# Patient Record
Sex: Female | Born: 1975 | Race: White | Hispanic: No | Smoking: Current every day smoker
Health system: Southern US, Community
[De-identification: ages and names within clinical notes are randomized; demographics above are authoritative.]

## PROBLEM LIST (undated history)

## (undated) DIAGNOSIS — E079 Disorder of thyroid, unspecified: Secondary | ICD-10-CM

## (undated) DIAGNOSIS — D649 Anemia, unspecified: Secondary | ICD-10-CM

## (undated) DIAGNOSIS — F329 Major depressive disorder, single episode, unspecified: Secondary | ICD-10-CM

## (undated) DIAGNOSIS — F32A Depression, unspecified: Secondary | ICD-10-CM

## (undated) HISTORY — PX: TONSILLECTOMY: SUR1361

## (undated) HISTORY — PX: THYROIDECTOMY: SHX17

## (undated) HISTORY — PX: CHOLECYSTECTOMY: SHX55

---
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
## 2000-10-23 ENCOUNTER — Emergency Department (HOSPITAL_COMMUNITY): Admission: EM | Admit: 2000-10-23 | Discharge: 2000-10-23 | Payer: Self-pay | Admitting: *Deleted

## 2000-10-26 ENCOUNTER — Emergency Department (HOSPITAL_COMMUNITY): Admission: EM | Admit: 2000-10-26 | Discharge: 2000-10-26 | Payer: Self-pay | Admitting: *Deleted

## 2001-02-26 ENCOUNTER — Emergency Department (HOSPITAL_COMMUNITY): Admission: EM | Admit: 2001-02-26 | Discharge: 2001-02-26 | Payer: Self-pay | Admitting: Emergency Medicine

## 2001-10-08 ENCOUNTER — Emergency Department (HOSPITAL_COMMUNITY): Admission: EM | Admit: 2001-10-08 | Discharge: 2001-10-09 | Payer: Self-pay | Admitting: Emergency Medicine

## 2001-10-10 ENCOUNTER — Emergency Department (HOSPITAL_COMMUNITY): Admission: EM | Admit: 2001-10-10 | Discharge: 2001-10-10 | Payer: Self-pay | Admitting: *Deleted

## 2002-02-15 ENCOUNTER — Emergency Department (HOSPITAL_COMMUNITY): Admission: EM | Admit: 2002-02-15 | Discharge: 2002-02-15 | Payer: Self-pay | Admitting: Emergency Medicine

## 2003-03-06 ENCOUNTER — Emergency Department (HOSPITAL_COMMUNITY): Admission: EM | Admit: 2003-03-06 | Discharge: 2003-03-06 | Payer: Self-pay | Admitting: Emergency Medicine

## 2003-03-24 ENCOUNTER — Emergency Department (HOSPITAL_COMMUNITY): Admission: EM | Admit: 2003-03-24 | Discharge: 2003-03-24 | Payer: Self-pay | Admitting: Emergency Medicine

## 2003-03-30 ENCOUNTER — Emergency Department (HOSPITAL_COMMUNITY): Admission: EM | Admit: 2003-03-30 | Discharge: 2003-03-30 | Payer: Self-pay | Admitting: Internal Medicine

## 2003-04-10 ENCOUNTER — Emergency Department (HOSPITAL_COMMUNITY): Admission: EM | Admit: 2003-04-10 | Discharge: 2003-04-10 | Payer: Self-pay | Admitting: Internal Medicine

## 2003-04-26 ENCOUNTER — Emergency Department (HOSPITAL_COMMUNITY): Admission: EM | Admit: 2003-04-26 | Discharge: 2003-04-26 | Payer: Self-pay | Admitting: *Deleted

## 2003-07-08 ENCOUNTER — Emergency Department (HOSPITAL_COMMUNITY): Admission: EM | Admit: 2003-07-08 | Discharge: 2003-07-08 | Payer: Self-pay | Admitting: Internal Medicine

## 2015-09-30 ENCOUNTER — Emergency Department (HOSPITAL_COMMUNITY)
Admission: EM | Admit: 2015-09-30 | Discharge: 2015-09-30 | Disposition: A | Discharge status: Home / Self Care | Payer: Medicaid Other | Attending: Emergency Medicine | Admitting: Emergency Medicine

## 2015-09-30 ENCOUNTER — Emergency Department (HOSPITAL_COMMUNITY): Payer: Medicaid Other

## 2015-09-30 ENCOUNTER — Encounter (HOSPITAL_COMMUNITY): Payer: Self-pay | Admitting: *Deleted

## 2015-09-30 DIAGNOSIS — F17210 Nicotine dependence, cigarettes, uncomplicated: Secondary | ICD-10-CM | POA: Insufficient documentation

## 2015-09-30 DIAGNOSIS — R1032 Left lower quadrant pain: Secondary | ICD-10-CM | POA: Insufficient documentation

## 2015-09-30 DIAGNOSIS — Z9049 Acquired absence of other specified parts of digestive tract: Secondary | ICD-10-CM | POA: Diagnosis not present

## 2015-09-30 DIAGNOSIS — E039 Hypothyroidism, unspecified: Secondary | ICD-10-CM | POA: Diagnosis not present

## 2015-09-30 DIAGNOSIS — Z79899 Other long term (current) drug therapy: Secondary | ICD-10-CM | POA: Diagnosis not present

## 2015-09-30 DIAGNOSIS — R109 Unspecified abdominal pain: Secondary | ICD-10-CM

## 2015-09-30 LAB — COMPREHENSIVE METABOLIC PANEL
ALT: 15 U/L (ref 14–54)
AST: 21 U/L (ref 15–41)
Albumin: 4.1 g/dL (ref 3.5–5.0)
Alkaline Phosphatase: 88 U/L (ref 38–126)
Anion gap: 9 (ref 5–15)
BILIRUBIN TOTAL: 0.7 mg/dL (ref 0.3–1.2)
BUN: 11 mg/dL (ref 6–20)
CO2: 21 mmol/L — ABNORMAL LOW (ref 22–32)
Calcium: 8 mg/dL — ABNORMAL LOW (ref 8.9–10.3)
Chloride: 105 mmol/L (ref 101–111)
Creatinine, Ser: 1.06 mg/dL — ABNORMAL HIGH (ref 0.44–1.00)
GFR calc Af Amer: >60 mL/min (ref >60)
GFR calc non Af Amer: >60 mL/min (ref >60)
Glucose, Bld: 108 mg/dL — ABNORMAL HIGH (ref 65–99)
POTASSIUM: 3.7 mmol/L (ref 3.5–5.1)
Sodium: 135 mmol/L (ref 135–145)
Total Protein: 7.5 g/dL (ref 6.5–8.1)

## 2015-09-30 LAB — URINALYSIS, ROUTINE W REFLEX MICROSCOPIC
Bilirubin Urine: NEGATIVE
Glucose, UA: NEGATIVE mg/dL
Ketones, ur: NEGATIVE mg/dL
Leukocytes, UA: NEGATIVE
Nitrite: NEGATIVE
Protein, ur: NEGATIVE mg/dL
Specific Gravity, Urine: <1.005 — ABNORMAL LOW (ref 1.005–1.030)
pH: 6 (ref 5.0–8.0)

## 2015-09-30 LAB — CBC
HCT: 37.3 % (ref 36.0–46.0)
HEMOGLOBIN: 11.8 g/dL — AB (ref 12.0–15.0)
MCH: 24.4 pg — ABNORMAL LOW (ref 26.0–34.0)
MCHC: 31.6 g/dL (ref 30.0–36.0)
MCV: 77.1 fL — ABNORMAL LOW (ref 78.0–100.0)
Platelets: 359 10*3/uL (ref 150–400)
RBC: 4.84 MIL/uL (ref 3.87–5.11)
RDW: 16.4 % — ABNORMAL HIGH (ref 11.5–15.5)
WBC: 8.7 10*3/uL (ref 4.0–10.5)

## 2015-09-30 LAB — TSH: TSH: >90 u[IU]/mL — ABNORMAL HIGH (ref 0.350–4.500)

## 2015-09-30 LAB — URINE MICROSCOPIC-ADD ON

## 2015-09-30 LAB — LIPASE, BLOOD: Lipase: 33 U/L (ref 11–51)

## 2015-09-30 LAB — PREGNANCY, URINE: Preg Test, Ur: NEGATIVE

## 2015-09-30 MED ORDER — ONDANSETRON HCL 4 MG/2ML IJ SOLN
4.0000 mg | Freq: Once | INTRAMUSCULAR | Status: AC
  Administered 2015-09-30: 4 mg via INTRAVENOUS
  Filled 2015-09-30: qty 2

## 2015-09-30 MED ORDER — IOPAMIDOL (ISOVUE-300) INJECTION 61%
100.0000 mL | Freq: Once | INTRAVENOUS | Status: AC | PRN
  Administered 2015-09-30: 100 mL via INTRAVENOUS

## 2015-09-30 MED ORDER — SODIUM CHLORIDE 0.9 % IV BOLUS (SEPSIS)
1000.0000 mL | Freq: Once | INTRAVENOUS | Status: AC
  Administered 2015-09-30: 1000 mL via INTRAVENOUS

## 2015-09-30 MED ORDER — SERTRALINE HCL 25 MG PO TABS: 25.0000 mg | ORAL_TABLET | Freq: Every day | ORAL | Status: DC

## 2015-09-30 MED ORDER — MORPHINE SULFATE (PF) 4 MG/ML IV SOLN
4.0000 mg | Freq: Once | INTRAVENOUS | Status: AC
  Administered 2015-09-30: 4 mg via INTRAVENOUS
  Filled 2015-09-30: qty 1

## 2015-09-30 NOTE — Discharge Instructions (Signed)
Prescription for one month for Zoloft 25 mg.  Follow-up your primary care doctor next week. You must take your Synthroid medication. CT scan showed no life-threatening condition.

## 2015-09-30 NOTE — ED Notes (Signed)
Pt comes in left side lower abdominal pain. Pt started her period yesterday. Pt has had symptoms like this before and was told she had hemorrhagic ovarian cysts. Pt has n/v/d.

## 2015-09-30 NOTE — ED Provider Notes (Signed)
CSN: [id number]     Arrival date & time 09/30/15  1542 History   First MD Initiated Contact with Patient 09/30/15 1604     Chief Complaint  Patient presents with  . Abdominal Pain     (Consider location/radiation/quality/duration/timing/severity/associated sxs/prior Treatment) HPI.... Lower abdominal pain for several months. Patient has been evaluated in multiple locations including 3 ultrasounds which have shown a "hemorrhagic cyst and ruptured cyst". She is in transition from [place] to [place] at this time and does not have a primary care doctor. The lower abdominal pain seems to start approximately one week prior to her menstrual period. She started her period yesterday. No fever, sweats, chills, dysuria, chest pain, dyspnea. Patient is hypothyroid and takes her Synthroid erratically. In the past she has been usually benefited by Zoloft  History reviewed. No pertinent past medical history. Past Surgical History  Procedure Laterality Date  . Thyroidectomy    . Cholecystectomy    . Tonsillectomy     No family history on file. Social History  Substance Use Topics  . Smoking status: Current Every Day Smoker -- 0.50 packs/day    Types: Cigarettes  . Smokeless tobacco: None  . Alcohol Use: No   OB History    No data available     Review of Systems  All other systems reviewed and are negative.     Allergies  Review of patient's allergies indicates no known allergies.  Home Medications   Prior to Admission medications   Medication Sig Start Date End Date Taking? Authorizing Provider  levothyroxine (SYNTHROID, LEVOTHROID) 200 MCG tablet Take 200 mcg by mouth daily before breakfast.   Yes Historical Provider, MD  sertraline (ZOLOFT) 25 MG tablet Take 1 tablet (25 mg total) by mouth daily. 09/30/15   Donnetta Hutching, MD   BP 123/78 mmHg  Pulse 65  Temp(Src) 98.2 F (36.8 C) (Oral)  Resp 14  Ht  (1.651 m)  Wt 216 lb (97.977 kg)  BMI 35.94 kg/m2  SpO2 100%  LMP  09/29/2015 Physical Exam  Constitutional: She is oriented to person, place, and time.  Obese.  HENT:  Head: Normocephalic and atraumatic.  Eyes: Conjunctivae and EOM are normal. Pupils are equal, round, and reactive to light.  Neck: Normal range of motion. Neck supple.  Cardiovascular: Normal rate and regular rhythm.   Pulmonary/Chest: Effort normal and breath sounds normal.  Abdominal: Soft. Bowel sounds are normal.  Minimal bilateral lower abdominal tenderness.  Musculoskeletal: Normal range of motion.  Neurological: She is alert and oriented to person, place, and time.  Skin: Skin is warm and dry.  Psychiatric: She has a normal mood and affect. Her behavior is normal.  Nursing note and vitals reviewed.   ED Course  Procedures (including critical care time) Labs Review Labs Reviewed  COMPREHENSIVE METABOLIC PANEL - Abnormal; Notable for the following:    CO2 21 (*)    Glucose, Bld 108 (*)    Creatinine, Ser 1.06 (*)    Calcium 8.0 (*)    All other components within normal limits  CBC - Abnormal; Notable for the following:    Hemoglobin 11.8 (*)    MCV 77.1 (*)    MCH 24.4 (*)    RDW 16.4 (*)    All other components within normal limits  URINALYSIS, ROUTINE W REFLEX MICROSCOPIC (NOT AT [facility]) - Abnormal; Notable for the following:    Specific Gravity, Urine <1.005 (*)    Hgb urine dipstick LARGE (*)    All  other components within normal limits  TSH - Abnormal; Notable for the following:    TSH >90.000 (*)    All other components within normal limits  URINE MICROSCOPIC-ADD ON - Abnormal; Notable for the following:    Squamous Epithelial / LPF 0-5 (*)    Bacteria, UA RARE (*)    All other components within normal limits  LIPASE, BLOOD  PREGNANCY, URINE    Imaging Review Ct Abdomen Pelvis W Contrast  09/30/2015  CLINICAL DATA:  Left lower quadrant abdominal pain since yesterday. Nausea. EXAM: CT ABDOMEN AND PELVIS WITH CONTRAST TECHNIQUE: Multidetector CT imaging of  the abdomen and pelvis was performed using the standard protocol following bolus administration of intravenous contrast. CONTRAST:  100mL ISOVUE-300 IOPAMIDOL (ISOVUE-300) INJECTION 61% COMPARISON:  None. FINDINGS: Lower chest: The lung bases are clear of acute process. No pleural effusion or pulmonary lesions. The heart is normal in size. No pericardial effusion. The distal esophagus and aorta are unremarkable. Hepatobiliary: No focal hepatic lesions or intrahepatic biliary dilatation. The gallbladder is surgically absent. No common bile duct dilatation. Pancreas: No mass, inflammation or ductal dilatation. Spleen: Normal size.  No focal lesions. Adrenals/Urinary Tract: The adrenal glands and kidneys are normal. Stomach/Bowel: The stomach, duodenum, small bowel and colon are unremarkable. No inflammatory changes, mass lesions or obstructive findings. The terminal ileum is normal. The appendix is normal. Vascular/Lymphatic: No mesenteric or retroperitoneal mass or adenopathy. Small scattered lymph nodes are noted. The aorta and branch vessels are patent. The major venous structures are patent. Reproductive: The uterus is retroverted. Suspect small fibroids. Nabothian cysts are noted near the cervix. Both ovaries are normal. Other: No pelvic mass or adenopathy. No free pelvic fluid collections. No inguinal mass or adenopathy. The bladder wall demonstrates mild symmetric wall thickening which may be due to lack of distension. Musculoskeletal: No significant bony findings. 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 IMPRESSION: 1. No acute abdominal/pelvic findings, mass lesions or lymphadenopathy. 2. Status post cholecystectomy without biliary dilatation. 3. Slightly enlarged retroverted uterus with probable fibroids and nabothian cysts. Electronically Signed   By: Rudie MeyerP.  Gallerani M.D.   On: 09/30/2015 18:53   I have personally reviewed  and evaluated these images and lab results as part of my medical decision-making.   EKG Interpretation None      MDM   Final diagnoses:  Abdominal pain, unspecified abdominal location  Hypothyroidism, unspecified hypothyroidism type   Patient is in no acute distress. White count normal. CT scan of abdomen/pelvis shows no acute findings. TSH greater than 90. Will Rx Zoloft 25 mg daily. Patient encouraged to take her Synthroid daily. She will get primary care follow-up within the week    Donnetta HutchingBrian Camari Quintanilla, MD 09/30/15 [phone]

## 2017-03-11 IMAGING — CT CT ABD-PELV W/ CM
2 of 4 series · 16 of 46 positions shown, 18 images · IV contrast (Omnipaque 300)
Comparison: None.

CLINICAL DATA: Left lower quadrant abdominal pain since yesterday.
Nausea.

EXAM:
CT ABDOMEN AND PELVIS WITH CONTRAST
TECHNIQUE: Multidetector CT imaging of the abdomen and pelvis was performed
using the standard protocol following bolus administration of
intravenous contrast.
CONTRAST:  100mL Q326PI-1PP IOPAMIDOL (Q326PI-1PP) INJECTION 61%

[Series 2: abd_pel_with 5.0 b40s · axial · 0.85mm/px · z∈[-468,-28]mm · 13 of 97 slices shown, 15 images]
[im 5/97  soft-tissue]
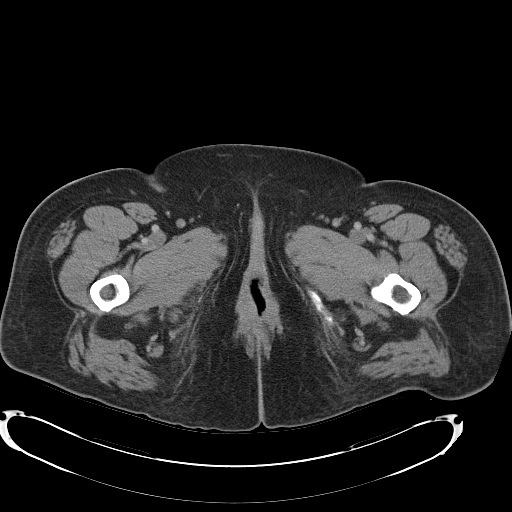
[im 5/97  bone]
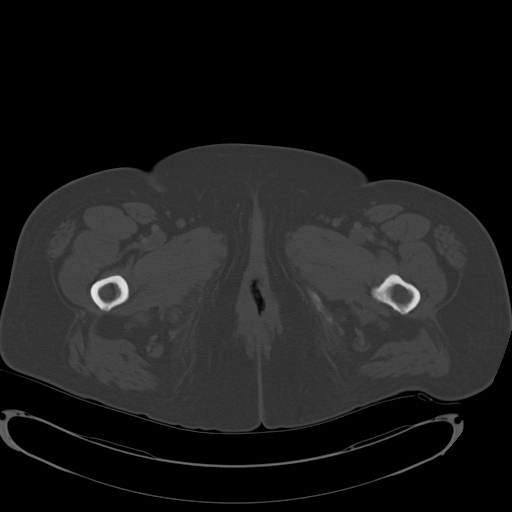
[im 13/97  soft-tissue]
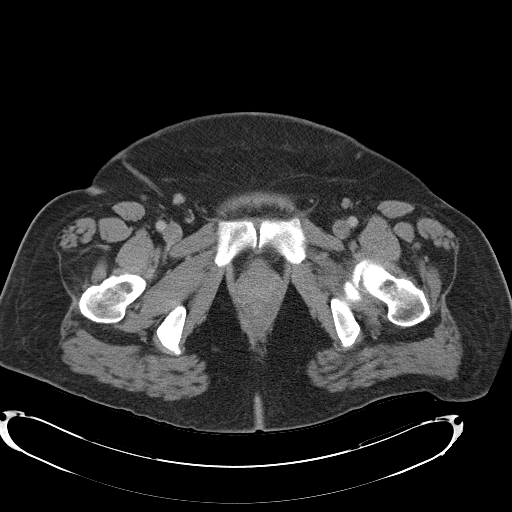
[im 21/97  soft-tissue]
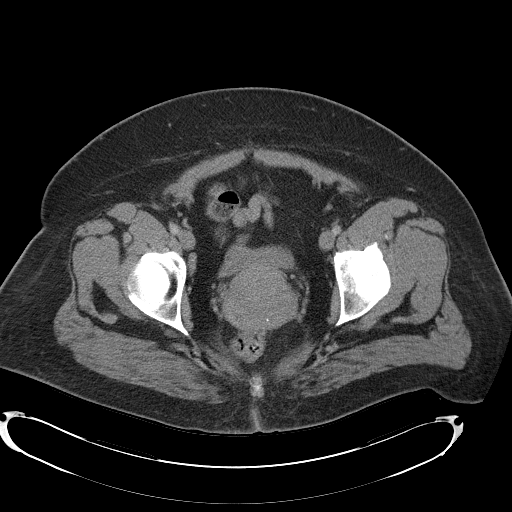
[im 29/97  soft-tissue]
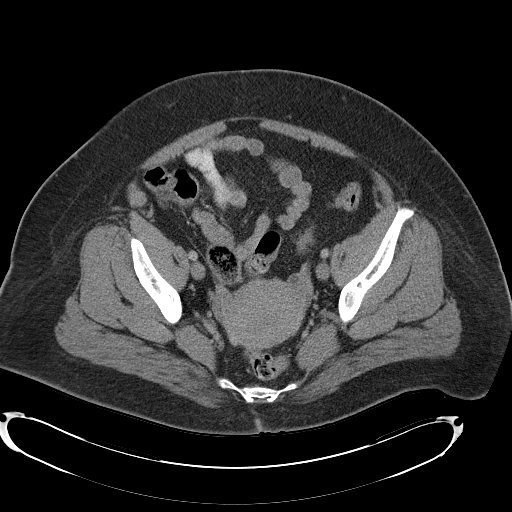
[im 33/97  soft-tissue]
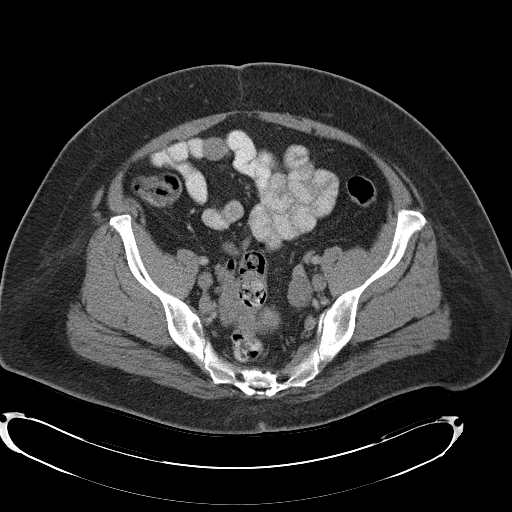
[im 41/97  soft-tissue]
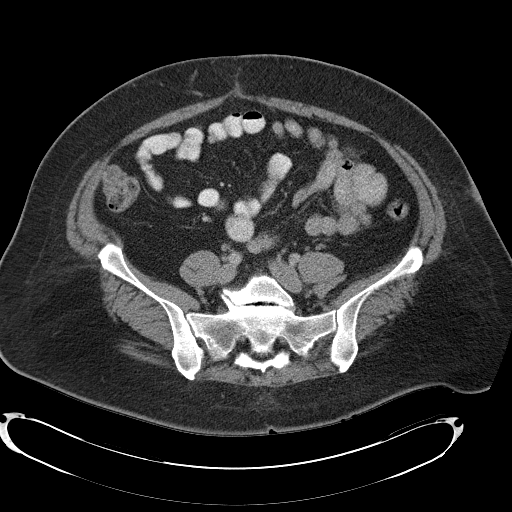
[im 49/97  soft-tissue]
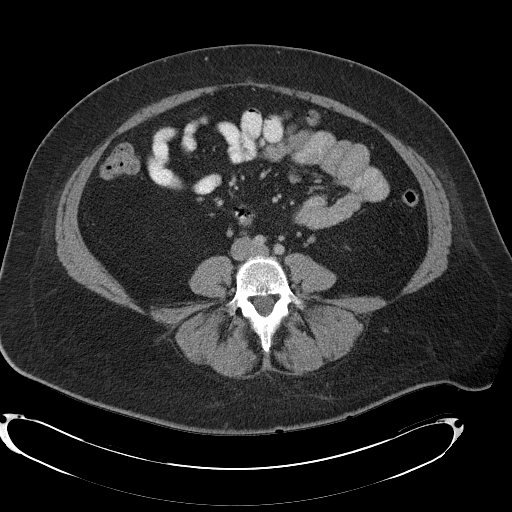
[im 57/97  soft-tissue]
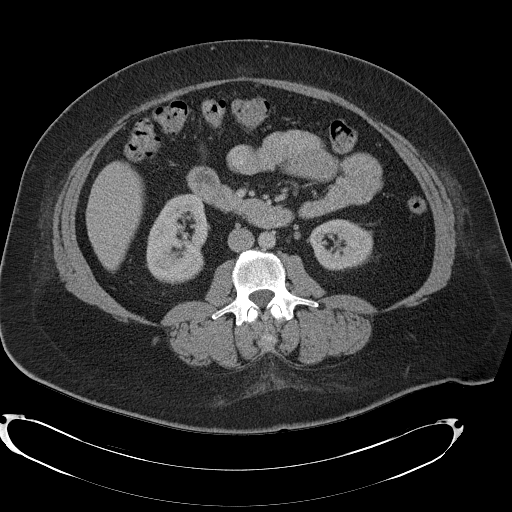
[im 65/97  soft-tissue]
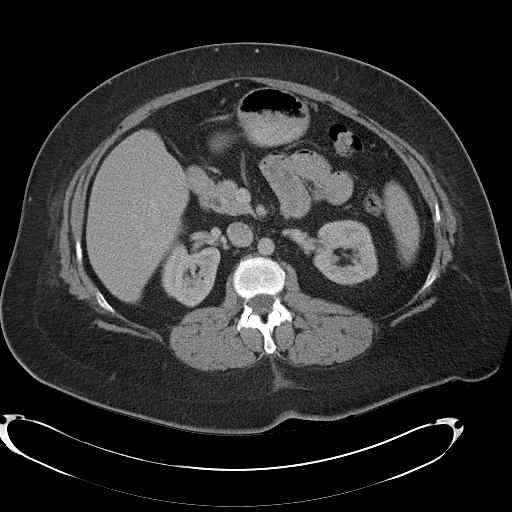
[im 65/97  bone]
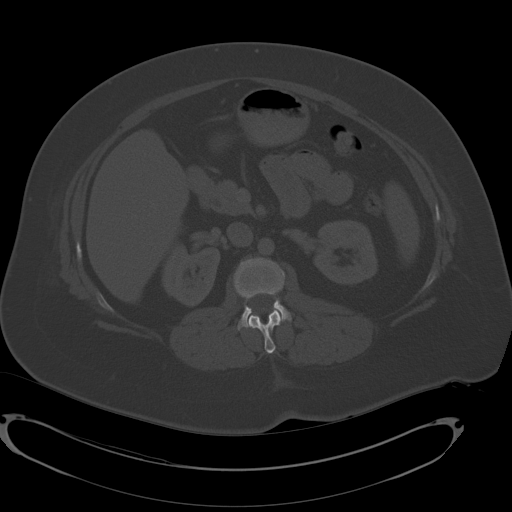
[im 69/97  soft-tissue]
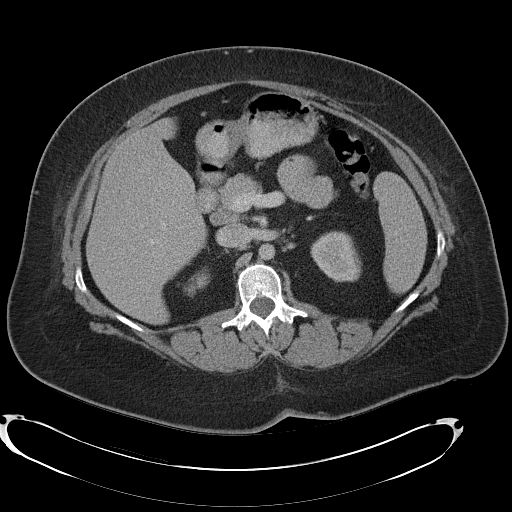
[im 77/97  soft-tissue]
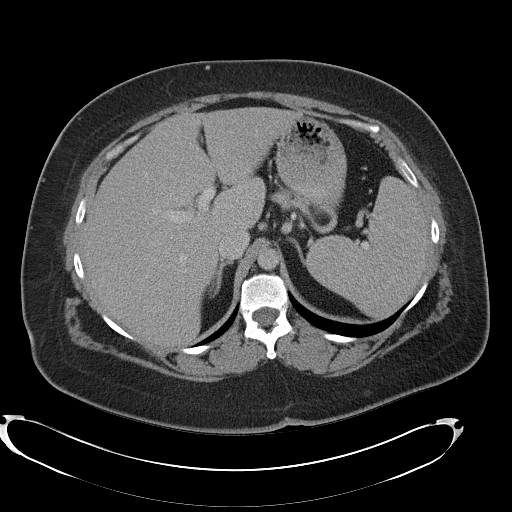
[im 85/97  soft-tissue]
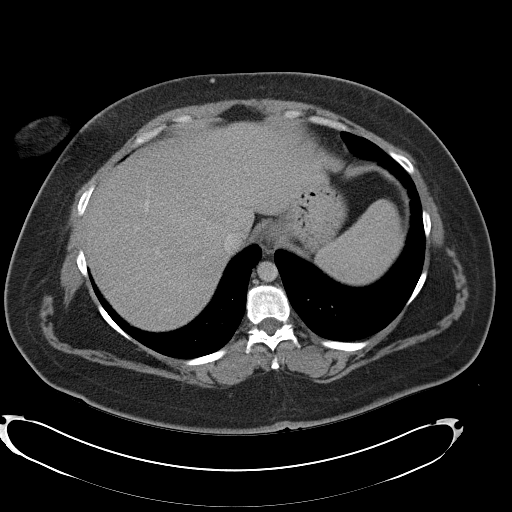
[im 93/97  soft-tissue]
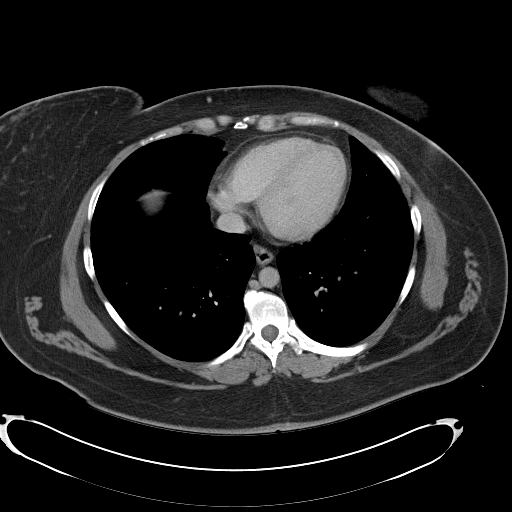

[Series 4: mpr cor post contrast (id) · coronal · 0.86mm/px · 3 of 110 slices shown]
[im 37/110  soft-tissue]
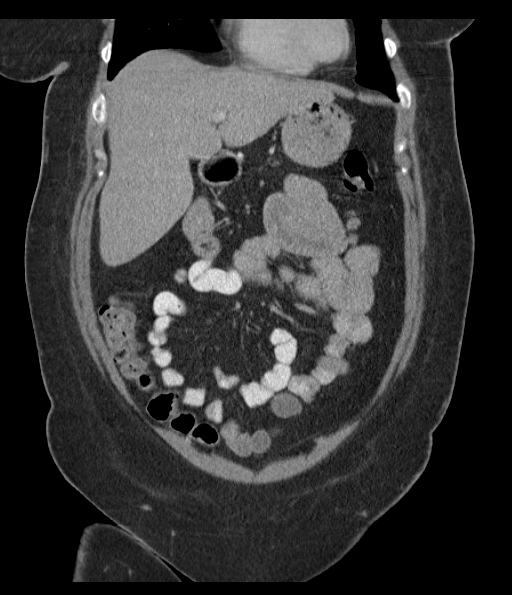
[im 49/110  soft-tissue]
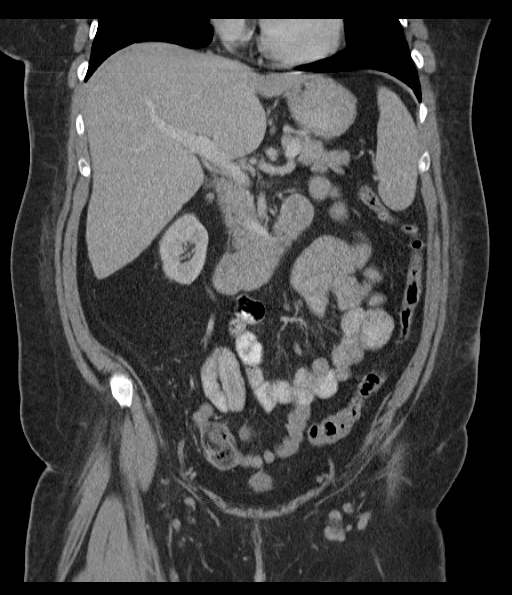
[im 61/110  soft-tissue]
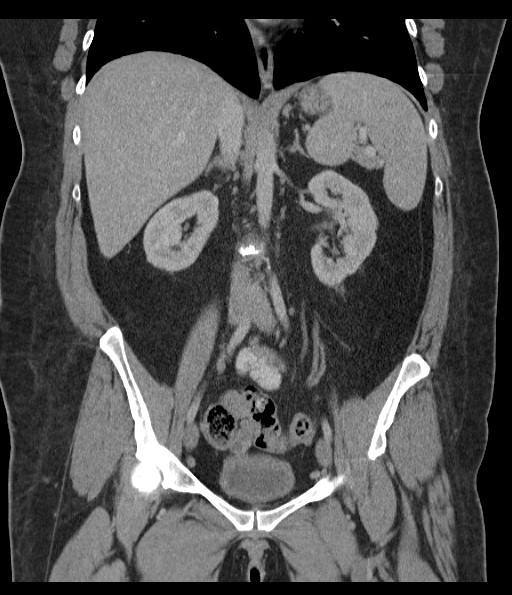

[16 of 46 positions shown; findings below may reference images not displayed]

FINDINGS: Lower chest: The lung bases are clear of acute process. No pleural
effusion or pulmonary lesions. The heart is normal in size. No
pericardial effusion. The distal esophagus and aorta are
unremarkable.

Hepatobiliary: No focal hepatic lesions or intrahepatic biliary
dilatation. The gallbladder is surgically absent. No common bile
duct dilatation.

Pancreas: No mass, inflammation or ductal dilatation.

Spleen: Normal size.  No focal lesions.

Adrenals/Urinary Tract: The adrenal glands and kidneys are normal.

Stomach/Bowel: The stomach, duodenum, small bowel and colon are
unremarkable. No inflammatory changes, mass lesions or obstructive
findings. The terminal ileum is normal. The appendix is normal.

Vascular/Lymphatic: No mesenteric or retroperitoneal mass or
adenopathy. Small scattered lymph nodes are noted. The aorta and
branch vessels are patent. The major venous structures are patent.

Reproductive: The uterus is retroverted. Suspect small fibroids.
Nabothian cysts are noted near the cervix. Both ovaries are normal.

Other: No pelvic mass or adenopathy. No free pelvic fluid
collections. No inguinal mass or adenopathy. The bladder wall
demonstrates mild symmetric wall thickening which may be due to lack
of distension.

Musculoskeletal: No significant bony findings. There are bilateral
pars defects at L5 with a grade 2 spondylolisthesis and advanced
disc disease and facet disease at L5-S1.
IMPRESSION: 1. No acute abdominal/pelvic findings, mass lesions or
lymphadenopathy.
2. Status post cholecystectomy without biliary dilatation.
3. Slightly enlarged retroverted uterus with probable fibroids and
nabothian cysts.

## 2019-06-30 ENCOUNTER — Other Ambulatory Visit: Payer: Self-pay

## 2019-06-30 ENCOUNTER — Encounter (HOSPITAL_BASED_OUTPATIENT_CLINIC_OR_DEPARTMENT_OTHER): Payer: Self-pay | Admitting: Emergency Medicine

## 2019-06-30 ENCOUNTER — Inpatient Hospital Stay (HOSPITAL_BASED_OUTPATIENT_CLINIC_OR_DEPARTMENT_OTHER)
Admission: EM | Admit: 2019-06-30 | Discharge: 2019-07-04 | DRG: 372 | Disposition: A | Discharge status: Home / Self Care | Payer: Medicaid Other | Attending: General Surgery | Admitting: General Surgery

## 2019-06-30 ENCOUNTER — Emergency Department (HOSPITAL_BASED_OUTPATIENT_CLINIC_OR_DEPARTMENT_OTHER): Payer: Medicaid Other

## 2019-06-30 DIAGNOSIS — E039 Hypothyroidism, unspecified: Secondary | ICD-10-CM | POA: Diagnosis present

## 2019-06-30 DIAGNOSIS — R1030 Lower abdominal pain, unspecified: Secondary | ICD-10-CM | POA: Diagnosis present

## 2019-06-30 DIAGNOSIS — F17210 Nicotine dependence, cigarettes, uncomplicated: Secondary | ICD-10-CM | POA: Diagnosis present

## 2019-06-30 DIAGNOSIS — Z20822 Contact with and (suspected) exposure to covid-19: Secondary | ICD-10-CM | POA: Diagnosis present

## 2019-06-30 DIAGNOSIS — K3532 Acute appendicitis with perforation and localized peritonitis, without abscess: Secondary | ICD-10-CM | POA: Diagnosis present

## 2019-06-30 DIAGNOSIS — D509 Iron deficiency anemia, unspecified: Secondary | ICD-10-CM | POA: Diagnosis present

## 2019-06-30 DIAGNOSIS — R17 Unspecified jaundice: Secondary | ICD-10-CM | POA: Diagnosis not present

## 2019-06-30 DIAGNOSIS — F329 Major depressive disorder, single episode, unspecified: Secondary | ICD-10-CM | POA: Diagnosis not present

## 2019-06-30 DIAGNOSIS — K352 Acute appendicitis with generalized peritonitis, without abscess: Secondary | ICD-10-CM

## 2019-06-30 HISTORY — DX: Depression, unspecified: F32.A

## 2019-06-30 HISTORY — DX: Disorder of thyroid, unspecified: E07.9

## 2019-06-30 HISTORY — DX: Anemia, unspecified: D64.9

## 2019-06-30 LAB — CBC WITH DIFFERENTIAL/PLATELET
Abs Immature Granulocytes: 0.11 10*3/uL — ABNORMAL HIGH (ref 0.00–0.07)
Basophils Absolute: 0 10*3/uL (ref 0.0–0.1)
Basophils Relative: 0 %
Eosinophils Absolute: 0 10*3/uL (ref 0.0–0.5)
Eosinophils Relative: 0 %
HCT: 31.8 % — ABNORMAL LOW (ref 36.0–46.0)
Hemoglobin: 9.1 g/dL — ABNORMAL LOW (ref 12.0–15.0)
Immature Granulocytes: 1 %
Lymphocytes Relative: 8 %
Lymphs Abs: 1.1 10*3/uL (ref 0.7–4.0)
MCH: 20.4 pg — ABNORMAL LOW (ref 26.0–34.0)
MCHC: 28.6 g/dL — ABNORMAL LOW (ref 30.0–36.0)
MCV: 71.3 fL — ABNORMAL LOW (ref 80.0–100.0)
Monocytes Absolute: 1 10*3/uL (ref 0.1–1.0)
Monocytes Relative: 7 %
Neutro Abs: 11.6 10*3/uL — ABNORMAL HIGH (ref 1.7–7.7)
Neutrophils Relative %: 84 %
Platelets: 388 10*3/uL (ref 150–400)
RBC: 4.46 MIL/uL (ref 3.87–5.11)
RDW: 17.2 % — ABNORMAL HIGH (ref 11.5–15.5)
WBC: 13.9 10*3/uL — ABNORMAL HIGH (ref 4.0–10.5)
nRBC: 0 % (ref 0.0–0.2)

## 2019-06-30 LAB — COMPREHENSIVE METABOLIC PANEL
ALT: 23 U/L (ref 0–44)
AST: 26 U/L (ref 15–41)
Albumin: 3.9 g/dL (ref 3.5–5.0)
Alkaline Phosphatase: 83 U/L (ref 38–126)
Anion gap: 11 (ref 5–15)
BUN: 10 mg/dL (ref 6–20)
CO2: 23 mmol/L (ref 22–32)
Calcium: 8.3 mg/dL — ABNORMAL LOW (ref 8.9–10.3)
Chloride: 101 mmol/L (ref 98–111)
Creatinine, Ser: 0.74 mg/dL (ref 0.44–1.00)
GFR calc Af Amer: >60 mL/min (ref >60)
GFR calc non Af Amer: >60 mL/min (ref >60)
Glucose, Bld: 131 mg/dL — ABNORMAL HIGH (ref 70–99)
Potassium: 3.7 mmol/L (ref 3.5–5.1)
Sodium: 135 mmol/L (ref 135–145)
Total Bilirubin: 2.4 mg/dL — ABNORMAL HIGH (ref 0.3–1.2)
Total Protein: 7.4 g/dL (ref 6.5–8.1)

## 2019-06-30 LAB — HCG, QUANTITATIVE, PREGNANCY: hCG, Beta Chain, Quant, S: <1 m[IU]/mL (ref <5)

## 2019-06-30 LAB — LIPASE, BLOOD: Lipase: 22 U/L (ref 11–51)

## 2019-06-30 LAB — URINALYSIS, ROUTINE W REFLEX MICROSCOPIC
Bilirubin Urine: NEGATIVE
Glucose, UA: NEGATIVE mg/dL
Ketones, ur: 20 mg/dL — AB
Leukocytes,Ua: NEGATIVE
Nitrite: NEGATIVE
Protein, ur: 30 mg/dL — AB
Specific Gravity, Urine: >1.046 — ABNORMAL HIGH (ref 1.005–1.030)
pH: 6 (ref 5.0–8.0)

## 2019-06-30 LAB — SURGICAL PCR SCREEN
MRSA, PCR: NEGATIVE
Staphylococcus aureus: POSITIVE — AB

## 2019-06-30 LAB — SARS CORONAVIRUS 2 BY RT PCR (HOSPITAL ORDER, PERFORMED IN ~~LOC~~ HOSPITAL LAB): SARS Coronavirus 2: NEGATIVE

## 2019-06-30 LAB — HIV ANTIBODY (ROUTINE TESTING W REFLEX): HIV Screen 4th Generation wRfx: NONREACTIVE

## 2019-06-30 MED ORDER — PIPERACILLIN-TAZOBACTAM 3.375 G IVPB
3.3750 g | Freq: Three times a day (TID) | INTRAVENOUS | Status: DC
  Administered 2019-06-30 – 2019-07-04 (×13): 3.375 g via INTRAVENOUS
  Filled 2019-06-30 (×13): qty 50

## 2019-06-30 MED ORDER — AMPHETAMINE-DEXTROAMPHET ER 10 MG PO CP24: 10.0000 mg | ORAL_CAPSULE | Freq: Every day | ORAL | Status: DC

## 2019-06-30 MED ORDER — ENOXAPARIN SODIUM 60 MG/0.6ML ~~LOC~~ SOLN
50.0000 mg | SUBCUTANEOUS | Status: DC
  Administered 2019-06-30 – 2019-07-02 (×2): 50 mg via SUBCUTANEOUS
  Filled 2019-06-30: qty 0.6
  Filled 2019-06-30: qty 0.5

## 2019-06-30 MED ORDER — ACETAMINOPHEN 325 MG PO TABS: 650.0000 mg | ORAL_TABLET | Freq: Four times a day (QID) | ORAL | Status: DC | PRN

## 2019-06-30 MED ORDER — ONDANSETRON 4 MG PO TBDP: 4.0000 mg | ORAL_TABLET | Freq: Four times a day (QID) | ORAL | Status: DC | PRN

## 2019-06-30 MED ORDER — KETOROLAC TROMETHAMINE 30 MG/ML IJ SOLN: 30.0000 mg | Freq: Four times a day (QID) | INTRAMUSCULAR | Status: DC | PRN

## 2019-06-30 MED ORDER — ENOXAPARIN SODIUM 40 MG/0.4ML ~~LOC~~ SOLN: 40.0000 mg | SUBCUTANEOUS | Status: DC

## 2019-06-30 MED ORDER — LEVOTHYROXINE SODIUM 100 MCG PO TABS
200.0000 ug | ORAL_TABLET | Freq: Every day | ORAL | Status: DC
  Administered 2019-06-30 – 2019-07-04 (×5): 200 ug via ORAL
  Filled 2019-06-30: qty 1
  Filled 2019-06-30: qty 2
  Filled 2019-06-30: qty 4
  Filled 2019-06-30 (×3): qty 2

## 2019-06-30 MED ORDER — IOHEXOL 300 MG/ML  SOLN
100.0000 mL | Freq: Once | INTRAMUSCULAR | Status: AC | PRN
  Administered 2019-06-30: 100 mL via INTRAVENOUS

## 2019-06-30 MED ORDER — SODIUM CHLORIDE 0.9 % IV SOLN: Freq: Once | INTRAVENOUS | Status: AC

## 2019-06-30 MED ORDER — OXYCODONE HCL 5 MG PO TABS
5.0000 mg | ORAL_TABLET | ORAL | Status: DC | PRN
  Administered 2019-06-30 – 2019-07-02 (×5): 10 mg via ORAL
  Administered 2019-07-03 (×2): 5 mg via ORAL
  Administered 2019-07-04: 10 mg via ORAL
  Administered 2019-07-04: 5 mg via ORAL
  Filled 2019-06-30 (×2): qty 2
  Filled 2019-06-30: qty 1
  Filled 2019-06-30: qty 2
  Filled 2019-06-30: qty 1
  Filled 2019-06-30 (×4): qty 2

## 2019-06-30 MED ORDER — ACETAMINOPHEN 650 MG RE SUPP: 650.0000 mg | Freq: Four times a day (QID) | RECTAL | Status: DC | PRN

## 2019-06-30 MED ORDER — SERTRALINE HCL 50 MG PO TABS: 25.0000 mg | ORAL_TABLET | Freq: Every day | ORAL | Status: DC

## 2019-06-30 MED ORDER — FENTANYL CITRATE (PF) 100 MCG/2ML IJ SOLN
50.0000 ug | INTRAMUSCULAR | Status: DC | PRN
  Administered 2019-06-30 (×2): 50 ug via INTRAVENOUS
  Filled 2019-06-30 (×2): qty 2

## 2019-06-30 MED ORDER — MORPHINE SULFATE (PF) 2 MG/ML IV SOLN
1.0000 mg | INTRAVENOUS | Status: DC | PRN
  Administered 2019-06-30: 2 mg via INTRAVENOUS
  Filled 2019-06-30: qty 1

## 2019-06-30 MED ORDER — PIPERACILLIN-TAZOBACTAM 3.375 G IVPB 30 MIN
3.3750 g | Freq: Once | INTRAVENOUS | Status: AC
  Administered 2019-06-30: 04:00:00 3.375 g via INTRAVENOUS
  Filled 2019-06-30 (×2): qty 50

## 2019-06-30 MED ORDER — KCL IN DEXTROSE-NACL 20-5-0.9 MEQ/L-%-% IV SOLN
INTRAVENOUS | Status: DC
  Filled 2019-06-30 (×5): qty 1000

## 2019-06-30 MED ORDER — BUSPIRONE HCL 10 MG PO TABS: 15.0000 mg | ORAL_TABLET | Freq: Two times a day (BID) | ORAL | Status: DC

## 2019-06-30 MED ORDER — KETOROLAC TROMETHAMINE 30 MG/ML IJ SOLN
15.0000 mg | Freq: Four times a day (QID) | INTRAMUSCULAR | Status: DC
  Administered 2019-06-30 – 2019-07-03 (×10): 15 mg via INTRAVENOUS
  Filled 2019-06-30 (×10): qty 1

## 2019-06-30 MED ORDER — KETOROLAC TROMETHAMINE 30 MG/ML IJ SOLN: 30.0000 mg | Freq: Four times a day (QID) | INTRAMUSCULAR | Status: DC

## 2019-06-30 MED ORDER — ONDANSETRON HCL 4 MG/2ML IJ SOLN
4.0000 mg | Freq: Once | INTRAMUSCULAR | Status: AC
  Administered 2019-06-30: 4 mg via INTRAVENOUS
  Filled 2019-06-30: qty 2

## 2019-06-30 MED ORDER — FENTANYL CITRATE (PF) 100 MCG/2ML IJ SOLN
100.0000 ug | Freq: Once | INTRAMUSCULAR | Status: AC
  Administered 2019-06-30: 02:00:00 100 ug via INTRAVENOUS
  Filled 2019-06-30: qty 2

## 2019-06-30 MED ORDER — ONDANSETRON HCL 4 MG/2ML IJ SOLN
4.0000 mg | Freq: Four times a day (QID) | INTRAMUSCULAR | Status: DC | PRN
  Administered 2019-06-30 – 2019-07-02 (×2): 4 mg via INTRAVENOUS
  Filled 2019-06-30 (×2): qty 2

## 2019-06-30 NOTE — ED Provider Notes (Signed)
I assumed care of this patient from Dr. Read Drivers.  Please see their note for further details of Hx, PE.  Briefly patient is a 44 y.o. female who presented as a transfer from med [facility] for ruptured appendicitis.  General surgery already aware the patient.  Will call them to inform them of their arrival..    CRITICAL CARE Performed by: Amadeo Garnet Jabriel Vanduyne Total critical care time: 20 minutes Critical care time was exclusive of separately billable procedures and treating other patients. Critical care was necessary to treat or prevent imminent or life-threatening deterioration. Critical care was time spent personally by me on the following activities: development of treatment plan with patient and/or surrogate as well as nursing, discussions with consultants, evaluation of patient's response to treatment, examination of patient, obtaining history from patient or surrogate, ordering and performing treatments and interventions, ordering and review of laboratory studies, ordering and review of radiographic studies, pulse oximetry and re-evaluation of patient's condition.     Nira Conn, MD 06/30/19 [phone]

## 2019-06-30 NOTE — H&P (Signed)
Tanya Robbins is an 44 y.o. female.   Chief Complaint: Abdominal pain HPI: Patient transferred from the [facility] emergency room after being seen by Dr. Florina Ou for a 3-day history of lower abdominal pain.  The pain started 3 days ago and was diffuse in her lower abdomen.  Last night it became worse and more centralized and toward the left lower quadrant.  The pain was made worse by coughing or moving.  It is sharp in nature and severe without radiation.  Pain medication does help relieve it.  CT scan showed a left-sided cecum with a perforated appendix with small micro lobules of free air around the appendix.  There is trace pelvic fluid.  She complains of lower abdominal pain which is severe 8 out of 10 without radiation location left lower quadrant and below her umbilicus.  History reviewed. No pertinent past medical history.  Past Surgical History:  Procedure Laterality Date  . CHOLECYSTECTOMY    . THYROIDECTOMY    . TONSILLECTOMY      No family history on file. Social History:  reports that she has been smoking cigarettes. She has been smoking about 0.50 packs per day. She does not have any smokeless tobacco history on file. She reports that she does not drink alcohol or use drugs.  Allergies: No Known Allergies  (Not in a hospital admission)   Results for orders placed or performed during the hospital encounter of 06/30/19 (from the past 48 hour(s))  CBC with Differential     Status: Abnormal   Collection Time: 06/30/19  2:10 AM  Result Value Ref Range   WBC 13.9 (H) 4.0 - 10.5 K/uL   RBC 4.46 3.87 - 5.11 MIL/uL   Hemoglobin 9.1 (L) 12.0 - 15.0 g/dL   HCT 31.8 (L) 36.0 - 46.0 %   MCV 71.3 (L) 80.0 - 100.0 fL   MCH 20.4 (L) 26.0 - 34.0 pg   MCHC 28.6 (L) 30.0 - 36.0 g/dL   RDW 17.2 (H) 11.5 - 15.5 %   Platelets 388 150 - 400 K/uL   nRBC 0.0 0.0 - 0.2 %   Neutrophils Relative % 84 %   Neutro Abs 11.6 (H) 1.7 - 7.7 K/uL   Lymphocytes Relative 8 %   Lymphs Abs 1.1 0.7 -  4.0 K/uL   Monocytes Relative 7 %   Monocytes Absolute 1.0 0.1 - 1.0 K/uL   Eosinophils Relative 0 %   Eosinophils Absolute 0.0 0.0 - 0.5 K/uL   Basophils Relative 0 %   Basophils Absolute 0.0 0.0 - 0.1 K/uL   Immature Granulocytes 1 %   Abs Immature Granulocytes 0.11 (H) 0.00 - 0.07 K/uL    Comment: Performed at [facility], [address] [place], [place] [address]  Comprehensive metabolic panel     Status: Abnormal   Collection Time: 06/30/19  2:10 AM  Result Value Ref Range   Sodium 135 135 - 145 mmol/L   Potassium 3.7 3.5 - 5.1 mmol/L   Chloride 101 98 - 111 mmol/L   CO2 23 22 - 32 mmol/L   Glucose, Bld 131 (H) 70 - 99 mg/dL   BUN 10 6 - 20 mg/dL   Creatinine, Ser 0.74 0.44 - 1.00 mg/dL   Calcium 8.3 (L) 8.9 - 10.3 mg/dL   Total Protein 7.4 6.5 - 8.1 g/dL   Albumin 3.9 3.5 - 5.0 g/dL   AST 26 15 - 41 U/L   ALT 23 0 - 44 U/L  Alkaline Phosphatase 83 38 - 126 U/L   Total Bilirubin 2.4 (H) 0.3 - 1.2 mg/dL   GFR calc non Af Amer >60 >60 mL/min   GFR calc Af Amer >60 >60 mL/min   Anion gap 11 5 - 15    Comment: Performed at [facility], [address] [place], [place] [address]  Lipase, blood     Status: None   Collection Time: 06/30/19  2:10 AM  Result Value Ref Range   Lipase 22 11 - 51 U/L    Comment: Performed at [facility], [address] [place], [place] [address]  hCG, quantitative, pregnancy     Status: None   Collection Time: 06/30/19  2:10 AM  Result Value Ref Range   hCG, Beta Chain, Quant, S <1 <5 mIU/mL    Comment:          GEST. AGE      CONC.  (mIU/mL)   <=1 WEEK        5 - 50     2 WEEKS       50 - 500     3 WEEKS       100 - 10,000     4 WEEKS     1,000 - 30,000     5 WEEKS     3,500 - 115,000   6-8 WEEKS     12,000 - 270,000    12 WEEKS     15,000 - 220,000        FEMALE AND NON-PREGNANT FEMALE:     LESS THAN 5 mIU/mL Performed at [facility], [address] [place], [place] [address]   SARS  Coronavirus 2 by RT PCR (hospital order, performed in [facility] hospital lab) Nasopharyngeal Nasopharyngeal Swab     Status: None   Collection Time: 06/30/19  3:57 AM   Specimen: Nasopharyngeal Swab  Result Value Ref Range   SARS Coronavirus 2 NEGATIVE NEGATIVE    Comment: Performed at [facility], [address] [place], [place] [address]   CT ABDOMEN PELVIS W CONTRAST  Result Date: 06/30/2019 CLINICAL DATA:  Diffuse abdominal pain for 3 days. EXAM: CT ABDOMEN AND PELVIS WITH CONTRAST TECHNIQUE: Multidetector CT imaging of the abdomen and pelvis was performed using the standard protocol following bolus administration of intravenous contrast. CONTRAST:  OMNIPAQUE IOHEXOL 300 MG/ML  SOLN COMPARISON:  CT abdomen pelvis 09/30/2015 FINDINGS: Lower chest: Lung bases are clear. Normal heart size. No pericardial effusion. Hepatobiliary: No focal liver abnormality is seen. Patient is post cholecystectomy. Slight prominence of the biliary tree likely related to reservoir effect. No calcified intraductal gallstones. Pancreas: Unremarkable. No pancreatic ductal dilatation or surrounding inflammatory changes. Spleen: Normal in size without focal abnormality. Adrenals/Urinary Tract: Normal adrenal glands. Mild bilateral symmetric perinephric stranding, a nonspecific finding though may correlate with either age or decreased renal function. No worrisome renal lesions. No urolithiasis or hydronephrosis. Mild bladder wall thickening may be partially attributable to decompression of the time of exam. Stomach/Bowel: Distal esophagus, stomach and duodenal sweep are unremarkable. There are a few focally dilated loops of small bowel in the left upper quadrant in a region of extensive stranding and phlegmonous change which may reflect a focal reactive ileus. The cecum is displaced into this region with a distended, hyperemic appendix measuring up to of 15 mm in diameter with evidence of gross perforation and  free air. Some adjacent reactive enhancement and  thickening of the peritoneal surfaces are also seen in the region of the perforated appendix. No organized abscess or collection is present. Distal colon has a more normal course and appearance without distal colonic wall thickening or dilatation. Vascular/Lymphatic: Atherosclerotic plaque within the normal caliber aorta. Reactive adenopathy in the mesentery. No pathologically enlarged nodes in the abdomen or pelvis. Reproductive: Retroflexed uterus. No concerning adnexal lesions. Other: Free fluid seen layering in the deep pelvis (7 HU) possibly reactive though nonspecific in a reproductive age female. Extensive phlegmon and free intraperitoneal air arising from the inflamed and dilated appendix seen in the left upper quadrant. Associated features of adjacent peritoneal thickening and stranding compatible with peritonitis. 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 Multilevel degenerative changes are present in the imaged portions of the spine. No acute osseous abnormality or suspicious osseous lesion. IMPRESSION: 1. The cecum is displaced into the left upper quadrant with CT evidence of perforated appendicitis with extensive phlegmon and free intraperitoneal air arising from the inflamed and dilated appendix measuring up to 15 mm in diameter. No organized abscess or collection is present at this time. 2. Free fluid is seen layering in the pelvis and pericolic gutter, which could reflect reactive free fluid or physiologic free fluid in a reproductive age female, succus is less favored given simple attenuation (7 HU). 3. There are a few focally dilated loops of small bowel in the left upper quadrant in a region of extensive stranding and phlegmonous change which may reflect a focal reactive ileus. 4. 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 5. Retroflexed uterus. 6.  Aortic Atherosclerosis  (ICD10-I70.0). These results were called by telephone at the time of interpretation on 06/30/2019 at 3:57 am to provider [facility] , who verbally acknowledged these results. Electronically Signed   By: Kreg Shropshire M.D.   On: 06/30/2019 03:58    Review of Systems  Constitutional: Positive for appetite change and fatigue.  Gastrointestinal: Positive for abdominal pain.  All other systems reviewed and are negative.   Blood pressure 121/66, pulse (!) 106, temperature 99.2 F (37.3 C), temperature source Oral, resp. rate 13, height 5\' 5"  (1.651 m), weight 101.6 kg, last menstrual period 06/25/2019, SpO2 99 %. Physical Exam  Constitutional: She is oriented to person, place, and time. She appears well-developed and well-nourished.  HENT:  Head: Normocephalic and atraumatic.  Eyes: Pupils are equal, round, and reactive to light. EOM are normal.  Cardiovascular: Normal rate and regular rhythm.  Respiratory: Effort normal. No respiratory distress.  GI: There is abdominal tenderness in the suprapubic area and left lower quadrant. There is rebound and guarding. There is no rigidity.  Musculoskeletal:        General: Normal range of motion.     Cervical back: Normal range of motion and neck supple.  Neurological: She is alert and oriented to person, place, and time.  Skin: Skin is warm and dry.     Assessment/Plan Perforated appendicitis without abscess  Discussed treatment options of medical versus surgical therapy.  Discussed increased complication rates with perforated appendicitis with surgical intervention initially.  Discussed the literature about medical management with antibiotics with possible interval appendectomy in 6 weeks.  Discussed the possibility of laparoscopic versus open surgery and higher conversion rate and possible bowel resection in the setting of perforated appendicitis.  After discussion of all options of treatment, the patient opted for antibiotic treatment at this point in  time.  Discussed possible surgical intervention if she fails medical management.  Dr. Gerrit Friends to follow up with patient later today to monitor progress.  Dortha Schwalbe, MD 06/30/2019, 5:36 AM

## 2019-06-30 NOTE — ED Triage Notes (Signed)
Pt c/o abd pain x 4 days vomiting x 4 days ago, none since and denies diarrhea.

## 2019-06-30 NOTE — ED Notes (Signed)
Notified pt of need for urine sample, refused to go at this time states "i'm bone dry."

## 2019-06-30 NOTE — Progress Notes (Signed)
CC: Abdominal pain  Subjective: Patient says pain is better this a.m. with pain medications.  She has had pain since Monday.  Became progressively worse and she presented to the ED last p.m.  Pain is currently most severe on the left side, left upper quadrant and left lower quadrant the most sensitive.  She also has kind of mid epigastric pain around and below the umbilicus.  Objective: Vital signs in last 24 hours: Temp:  [98.7 F (37.1 C)-99.2 F (37.3 C)] 99.2 F (37.3 C) (01/08 0501) Pulse Rate:  [97-112] 112 (01/08 0630) Resp:  [13-28] 27 (01/08 0630) BP: (105-121)/(58-91) 120/69 (01/08 0630) SpO2:  [96 %-100 %] 96 % (01/08 0630) Weight:  [101.6 kg] 101.6 kg (01/08 0158)  No intake/output recorded T-max 99.2 tachycardic with heart rate in the 100s, blood pressure stable. Admission labs shows slightly elevated glucose of 131, total bilirubin 2.4. WBC 13.9, H/H 9.1/31.8, platelets 388,000. CT of the abdomen shows the cecum is displacing left upper quadrant with evidence of a perforated appendicitis with extensive phlegmon and free intraperitoneal air arising from the inflamed and dilated appendix measuring up to 15 mm in diameter.  No organized abscess or collection of the present time.  There is free fluid in the pelvis seen layering in the pelvis and paracolic gutter. Intake/Output from previous day: 01/07 0701 - 01/08 0700 In: 50 [IV Piggyback:50] Out: -  Intake/Output this shift: No intake/output data recorded.  General appearance: alert, cooperative and no distress Resp: clear to auscultation bilaterally GI: Soft, she is not distended.  She complains of pain in the midepigastric area.  Both the upper and the lower left quadrants are tender to palpation.  She is not really all that tender over the right side.  Few bowel sounds no BM or flatus.  Lab Results:  Recent Labs    06/30/19 0210  WBC 13.9*  HGB 9.1*  HCT 31.8*  PLT 388    BMET Recent Labs     06/30/19 0210  NA 135  K 3.7  CL 101  CO2 23  GLUCOSE 131*  BUN 10  CREATININE 0.74  CALCIUM 8.3*   PT/INR No results for input(s): LABPROT, INR in the last 72 hours.  Recent Labs  Lab 06/30/19 0210  AST 26  ALT 23  ALKPHOS 83  BILITOT 2.4*  PROT 7.4  ALBUMIN 3.9     Lipase     Component Value Date/Time   LIPASE 22 06/30/2019 0210   Prior to Admission medications   Medication Sig Start Date End Date Taking? Authorizing Provider  amphetamine-dextroamphetamine (ADDERALL XR) 10 MG 24 hr capsule Take 1 tablet (10mg ) every evening 05/30/19  not taking Yes [provider]  ibuprofen (ADVIL) 600 MG tablet Take by mouth. 04/15/18  Yes [provider]  levothyroxine (SYNTHROID, LEVOTHROID) 200 MCG tablet Take 200 mcg by mouth daily before breakfast.   Yes [provider]  methocarbamol (ROBAXIN) 500 MG tablet Take by mouth. 04/23/18  not taking Yes [provider]  busPIRone (BUSPAR) 15 MG tablet Take 15 mg by mouth 2 (two) times daily. 01/03/19  not taking  [provider]  sertraline (ZOLOFT) 25 MG tablet Take 1 tablet (25 mg total) by mouth daily. 09/30/15  not taking  Nat Christen, MD      Medications:   Assessment/Plan Hypothyroid Covid negative  Perforated appendicitis  -Medical management with IV Zosyn/IV hydration/clear liquids  FEN: IV fluids/clear liquids ID: Zosyn 1/7  >> day 2  DVT: Lovenox Follow-up: Dr. Luisa Hart  Plan: Patient is being admitted she has had pain since Monday.  She is opted to go with medical management currently.  The only home medication she is actually taking currently is the Synthroid.  Awaiting bed assignment.      LOS: 0 days    Tanya Robbins 06/30/2019 Please see Amion

## 2019-06-30 NOTE — ED Provider Notes (Addendum)
[facility] Provider Note: Lowella Dell, MD, FACEP  CSN: [id number] MRN: [id number] ARRIVAL: 06/30/19 at 0149 ROOM: [facility]   CHIEF COMPLAINT  Abdominal Pain   HISTORY OF PRESENT ILLNESS  06/30/19 2:11 AM Tanya Robbins is a 44 y.o. female who had this fairly sudden onset of crampy abdominal pain 3 mornings ago.  The pain was moderate at that time and was associated with nausea and vomiting which resolved.  The pain has persisted and is become sharp and primarily centrally located but the location of the pain does vary.  There is also a dull component to the pain as well.  She describes the pain is the worst pain she has ever felt in her life.  She has had no further nausea, vomiting or diarrhea.  She has had chills off and on but no fever.  The pain is worse with movement and certain positions.  She has had a decreased appetite and decreased oral intake.  History reviewed. No pertinent past medical history.  Past Surgical History:  Procedure Laterality Date  . CHOLECYSTECTOMY    . THYROIDECTOMY    . TONSILLECTOMY      No family history on file.  Social History   Tobacco Use  . Smoking status: Current Every Day Smoker    Packs/day: 0.50    Types: Cigarettes  Substance Use Topics  . Alcohol use: No  . Drug use: No    Prior to Admission medications   Medication Sig Start Date End Date Taking? Authorizing Provider  amphetamine-dextroamphetamine (ADDERALL XR) 10 MG 24 hr capsule Take 1 tablet (10mg ) every evening 05/30/19  Yes [provider]  ibuprofen (ADVIL) 600 MG tablet Take by mouth. 04/15/18  Yes [provider]  levothyroxine (SYNTHROID, LEVOTHROID) 200 MCG tablet Take 200 mcg by mouth daily before breakfast.   Yes [provider]  methocarbamol (ROBAXIN) 500 MG tablet Take by mouth. 04/23/18  Yes [provider]  busPIRone (BUSPAR) 15 MG tablet Take 15 mg by mouth 2 (two) times daily. 01/03/19   [provider]  sertraline (ZOLOFT) 25 MG tablet Take 1 tablet (25 mg total) by mouth daily. 09/30/15   11/30/15, MD    Allergies Patient has no known allergies.   REVIEW OF SYSTEMS  Negative except as noted here or in the History of Present Illness.   PHYSICAL EXAMINATION  Initial Vital Signs Blood pressure (!) 117/58, pulse 97, temperature 98.7 F (37.1 C), temperature source Oral, resp. rate 20, height 5\' 5"  (1.651 m), weight 101.6 kg, last menstrual period 06/25/2019, SpO2 100 %.  Examination General: Well-developed, well-nourished female in no acute distress; appearance consistent with age of record HENT: normocephalic; atraumatic Eyes: pupils equal, round and reactive to light; extraocular muscles intact Neck: supple Heart: regular rate and rhythm Lungs: clear to auscultation bilaterally Abdomen: soft; nondistended; diffusely tender but more prominent on the left; bowel sounds present Extremities: No deformity; full range of motion; pulses normal Neurologic: Awake, alert and oriented; motor function intact in all extremities and symmetric; no facial droop Skin: Warm and dry; facial hirsutism Psychiatric: Grimacing   RESULTS  Summary of this visit's results, reviewed and interpreted by myself:   EKG Interpretation  Date/Time:    Ventricular Rate:    PR Interval:    QRS Duration:   QT Interval:    QTC Calculation:   R Axis:     Text Interpretation:        Laboratory Studies: Results for  orders placed or performed during the hospital encounter of 06/30/19 (from the past 24 hour(s))  CBC with Differential     Status: Abnormal   Collection Time: 06/30/19  2:10 AM  Result Value Ref Range   WBC 13.9 (H) 4.0 - 10.5 K/uL   RBC 4.46 3.87 - 5.11 MIL/uL   Hemoglobin 9.1 (L) 12.0 - 15.0 g/dL   HCT 03.0 (L) 09.2 - 33.0 %   MCV 71.3 (L) 80.0 - 100.0 fL   MCH 20.4 (L) 26.0 - 34.0 pg   MCHC 28.6 (L) 30.0 - 36.0 g/dL   RDW 07.6 (H) 22.6 - 33.3 %   Platelets 388 150 -  400 K/uL   nRBC 0.0 0.0 - 0.2 %   Neutrophils Relative % 84 %   Neutro Abs 11.6 (H) 1.7 - 7.7 K/uL   Lymphocytes Relative 8 %   Lymphs Abs 1.1 0.7 - 4.0 K/uL   Monocytes Relative 7 %   Monocytes Absolute 1.0 0.1 - 1.0 K/uL   Eosinophils Relative 0 %   Eosinophils Absolute 0.0 0.0 - 0.5 K/uL   Basophils Relative 0 %   Basophils Absolute 0.0 0.0 - 0.1 K/uL   Immature Granulocytes 1 %   Abs Immature Granulocytes 0.11 (H) 0.00 - 0.07 K/uL  Comprehensive metabolic panel     Status: Abnormal   Collection Time: 06/30/19  2:10 AM  Result Value Ref Range   Sodium 135 135 - 145 mmol/L   Potassium 3.7 3.5 - 5.1 mmol/L   Chloride 101 98 - 111 mmol/L   CO2 23 22 - 32 mmol/L   Glucose, Bld 131 (H) 70 - 99 mg/dL   BUN 10 6 - 20 mg/dL   Creatinine, Ser 5.45 0.44 - 1.00 mg/dL   Calcium 8.3 (L) 8.9 - 10.3 mg/dL   Total Protein 7.4 6.5 - 8.1 g/dL   Albumin 3.9 3.5 - 5.0 g/dL   AST 26 15 - 41 U/L   ALT 23 0 - 44 U/L   Alkaline Phosphatase 83 38 - 126 U/L   Total Bilirubin 2.4 (H) 0.3 - 1.2 mg/dL   GFR calc non Af Amer >60 >60 mL/min   GFR calc Af Amer >60 >60 mL/min   Anion gap 11 5 - 15  Lipase, blood     Status: None   Collection Time: 06/30/19  2:10 AM  Result Value Ref Range   Lipase 22 11 - 51 U/L  hCG, quantitative, pregnancy     Status: None   Collection Time: 06/30/19  2:10 AM  Result Value Ref Range   hCG, Beta Chain, Quant, S <1 <5 mIU/mL   Imaging Studies: CT ABDOMEN PELVIS W CONTRAST  Result Date: 06/30/2019 CLINICAL DATA:  Diffuse abdominal pain for 3 days. EXAM: CT ABDOMEN AND PELVIS WITH CONTRAST TECHNIQUE: Multidetector CT imaging of the abdomen and pelvis was performed using the standard protocol following bolus administration of intravenous contrast. CONTRAST:  OMNIPAQUE IOHEXOL 300 MG/ML  SOLN COMPARISON:  CT abdomen pelvis 09/30/2015 FINDINGS: Lower chest: Lung bases are clear. Normal heart size. No pericardial effusion. Hepatobiliary: No focal liver abnormality is  seen. Patient is post cholecystectomy. Slight prominence of the biliary tree likely related to reservoir effect. No calcified intraductal gallstones. Pancreas: Unremarkable. No pancreatic ductal dilatation or surrounding inflammatory changes. Spleen: Normal in size without focal abnormality. Adrenals/Urinary Tract: Normal adrenal glands. Mild bilateral symmetric perinephric stranding, a nonspecific finding though may correlate with either age or decreased renal function. No worrisome  renal lesions. No urolithiasis or hydronephrosis. Mild bladder wall thickening may be partially attributable to decompression of the time of exam. Stomach/Bowel: Distal esophagus, stomach and duodenal sweep are unremarkable. There are a few focally dilated loops of small bowel in the left upper quadrant in a region of extensive stranding and phlegmonous change which may reflect a focal reactive ileus. The cecum is displaced into this region with a distended, hyperemic appendix measuring up to of 15 mm in diameter with evidence of gross perforation and free air. Some adjacent reactive enhancement and thickening of the peritoneal surfaces are also seen in the region of the perforated appendix. No organized abscess or collection is present. Distal colon has a more normal course and appearance without distal colonic wall thickening or dilatation. Vascular/Lymphatic: Atherosclerotic plaque within the normal caliber aorta. Reactive adenopathy in the mesentery. No pathologically enlarged nodes in the abdomen or pelvis. Reproductive: Retroflexed uterus. No concerning adnexal lesions. Other: Free fluid seen layering in the deep pelvis (7 HU) possibly reactive though nonspecific in a reproductive age female. Extensive phlegmon and free intraperitoneal air arising from the inflamed and dilated appendix seen in the left upper quadrant. Associated features of adjacent peritoneal thickening and stranding compatible with peritonitis. 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 Multilevel degenerative changes are present in the imaged portions of the spine. No acute osseous abnormality or suspicious osseous lesion. IMPRESSION: 1. The cecum is displaced into the left upper quadrant with CT evidence of perforated appendicitis with extensive phlegmon and free intraperitoneal air arising from the inflamed and dilated appendix measuring up to 15 mm in diameter. No organized abscess or collection is present at this time. 2. Free fluid is seen layering in the pelvis and pericolic gutter, which could reflect reactive free fluid or physiologic free fluid in a reproductive age female, succus is less favored given simple attenuation (7 HU). 3. There are a few focally dilated loops of small bowel in the left upper quadrant in a region of extensive stranding and phlegmonous change which may reflect a focal reactive ileus. 4. 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 5. Retroflexed uterus. 6.  Aortic Atherosclerosis (ICD10-I70.0). These results were called by telephone at the time of interpretation on 06/30/2019 at 3:57 am to provider [facility] , who verbally acknowledged these results. Electronically Signed   By: Lovena Le M.D.   On: 06/30/2019 03:58    ED COURSE and MDM  Nursing notes, initial and subsequent vitals signs, including pulse oximetry, reviewed and interpreted by myself.  Vitals:   06/30/19 0158 06/30/19 0203  BP:  (!) 117/58  Pulse:  97  Resp:  20  Temp:  98.7 F (37.1 C)  TempSrc:  Oral  SpO2:  100%  Weight: 101.6 kg   Height: 5\' 5"  (1.651 m)    Medications  piperacillin-tazobactam (ZOSYN) IVPB 3.375 g (3.375 g Intravenous New Bag/Given 06/30/19 0405)  fentaNYL (SUBLIMAZE) injection 50 mcg (has no administration in time range)  ondansetron (ZOFRAN) injection 4 mg (4 mg Intravenous Given 06/30/19 0212)  fentaNYL (SUBLIMAZE) injection 100 mcg (100 mcg Intravenous Given  06/30/19 0212)  0.9 %  sodium chloride infusion ( Intravenous New Bag/Given 06/30/19 0217)  iohexol (OMNIPAQUE) 300 MG/ML solution 100 mL (100 mLs Intravenous Contrast Given 06/30/19 0316)   3:57 AM Zosyn ordered for appendicitis with perforation.  4:06 AM Dr. Brantley Stage of [facility] accepts for transfer to [facility] long ED.  Dr. Franchot Gallo, EDP, aware.  4:14 AM [facility] at bedside for transport.   PROCEDURES  Procedures   ED DIAGNOSES     ICD-10-CM   1. Acute appendicitis with perforation and generalized peritonitis, without abscess, unspecified whether gangrene present  K35.20        Stesha Neyens, MD 06/30/19 0411    Paula Libra, MD 06/30/19 [phone]

## 2019-06-30 NOTE — ED Notes (Signed)
Dr Davina Poke contacted notified that pt is in room 22

## 2019-07-01 ENCOUNTER — Encounter (HOSPITAL_COMMUNITY): Payer: Self-pay

## 2019-07-01 LAB — COMPREHENSIVE METABOLIC PANEL
ALT: 34 U/L (ref 0–44)
AST: 49 U/L — ABNORMAL HIGH (ref 15–41)
Albumin: 2.9 g/dL — ABNORMAL LOW (ref 3.5–5.0)
Alkaline Phosphatase: 81 U/L (ref 38–126)
Anion gap: 10 (ref 5–15)
BUN: 8 mg/dL (ref 6–20)
CO2: 21 mmol/L — ABNORMAL LOW (ref 22–32)
Calcium: 7.3 mg/dL — ABNORMAL LOW (ref 8.9–10.3)
Chloride: 103 mmol/L (ref 98–111)
Creatinine, Ser: 0.81 mg/dL (ref 0.44–1.00)
GFR calc Af Amer: >60 mL/min (ref >60)
GFR calc non Af Amer: >60 mL/min (ref >60)
Glucose, Bld: 113 mg/dL — ABNORMAL HIGH (ref 70–99)
Potassium: 3.8 mmol/L (ref 3.5–5.1)
Sodium: 134 mmol/L — ABNORMAL LOW (ref 135–145)
Total Bilirubin: 4 mg/dL — ABNORMAL HIGH (ref 0.3–1.2)
Total Protein: 5.9 g/dL — ABNORMAL LOW (ref 6.5–8.1)

## 2019-07-01 LAB — CBC
HCT: 26.3 % — ABNORMAL LOW (ref 36.0–46.0)
Hemoglobin: 7.3 g/dL — ABNORMAL LOW (ref 12.0–15.0)
MCH: 20.1 pg — ABNORMAL LOW (ref 26.0–34.0)
MCHC: 27.8 g/dL — ABNORMAL LOW (ref 30.0–36.0)
MCV: 72.5 fL — ABNORMAL LOW (ref 80.0–100.0)
Platelets: 278 10*3/uL (ref 150–400)
RBC: 3.63 MIL/uL — ABNORMAL LOW (ref 3.87–5.11)
RDW: 17.1 % — ABNORMAL HIGH (ref 11.5–15.5)
WBC: 11.7 10*3/uL — ABNORMAL HIGH (ref 4.0–10.5)
nRBC: 0 % (ref 0.0–0.2)

## 2019-07-01 LAB — BILIRUBIN, FRACTIONATED(TOT/DIR/INDIR)
Bilirubin, Direct: 1.1 mg/dL — ABNORMAL HIGH (ref 0.0–0.2)
Indirect Bilirubin: 2.9 mg/dL — ABNORMAL HIGH (ref 0.3–0.9)
Total Bilirubin: 4 mg/dL — ABNORMAL HIGH (ref 0.3–1.2)

## 2019-07-01 MED ORDER — LIP MEDEX EX OINT
TOPICAL_OINTMENT | CUTANEOUS | Status: AC
  Filled 2019-07-01: qty 7

## 2019-07-01 NOTE — Progress Notes (Signed)
CC: Abdominal pain  Subjective: Pain continues to improve significantly.  Denies n/v.  Passing gas.  Not requesting pain meds.     Objective: Vital signs in last 24 hours: Temp:  [97.8 F (36.6 C)-98.7 F (37.1 C)] 97.8 F (36.6 C) (01/09 0517) Pulse Rate:  [74-106] 100 (01/09 0517) Resp:  [15-21] 18 (01/09 0517) BP: (100-116)/(55-69) 108/60 (01/09 0517) SpO2:  [95 %-98 %] 96 % (01/09 0517) Last BM Date: 01/05/21No intake/output recorded  CT of the abdomen shows the cecum is displacing left upper quadrant with evidence of a perforated appendicitis with extensive phlegmon and free intraperitoneal air arising from the inflamed and dilated appendix measuring up to 15 mm in diameter.  No organized abscess or collection of the present time.  There is free fluid in the pelvis seen layering in the pelvis and paracolic gutter.  Intake/Output from previous day: 01/08 0701 - 01/09 0700 In: 1370.2 [P.O.:240; I.V.:954; IV Piggyback:176.3] Out: -  Intake/Output this shift: No intake/output data recorded.  General appearance: alert, cooperative and no distress Resp: breathing comfortably GI: Soft, ND, mild LUQ fullness.   Ext - warm, well perfused.   Skin - a bit pale.    Lab Results:  Recent Labs    06/30/19 0210 07/01/19 0410  WBC 13.9* 11.7*  HGB 9.1* 7.3*  HCT 31.8* 26.3*  PLT 388 278    BMET Recent Labs    06/30/19 0210 07/01/19 0410  NA 135 134*  K 3.7 3.8  CL 101 103  CO2 23 21*  GLUCOSE 131* 113*  BUN 10 8  CREATININE 0.74 0.81  CALCIUM 8.3* 7.3*   PT/INR No results for input(s): LABPROT, INR in the last 72 hours.  Recent Labs  Lab 06/30/19 0210 07/01/19 0410  AST 26 49*  ALT 23 34  ALKPHOS 83 81  BILITOT 2.4* 4.0*  PROT 7.4 5.9*  ALBUMIN 3.9 2.9*     Lipase     Component Value Date/Time   LIPASE 22 06/30/2019 0210   Prior to Admission medications   Medication Sig Start Date End Date Taking? Authorizing Provider   amphetamine-dextroamphetamine (ADDERALL XR) 10 MG 24 hr capsule Take 1 tablet (10mg ) every evening 05/30/19  not taking Yes [provider]  ibuprofen (ADVIL) 600 MG tablet Take by mouth. 04/15/18  Yes [provider]  levothyroxine (SYNTHROID, LEVOTHROID) 200 MCG tablet Take 200 mcg by mouth daily before breakfast.   Yes [provider]  methocarbamol (ROBAXIN) 500 MG tablet Take by mouth. 04/23/18  not taking Yes [provider]  busPIRone (BUSPAR) 15 MG tablet Take 15 mg by mouth 2 (two) times daily. 01/03/19  not taking  [provider]  sertraline (ZOLOFT) 25 MG tablet Take 1 tablet (25 mg total) by mouth daily. 09/30/15  not taking  11/30/15, MD      Medications: . enoxaparin (LOVENOX) injection  50 mg Subcutaneous Q24H  . ketorolac  15 mg Intravenous Q6H  . levothyroxine  200 mcg Oral QAC breakfast    Assessment/Plan  Perforated appendicitis  -Medical management with IV Zosyn/IV hydration/clear liquids  FEN: IV fluids, advance to full liquids.   ID: Zosyn 1/7  >> day 3 DVT: Lovenox- hold today for anemia Follow-up: Dr. 3/7  Hyperbilirubinemia Anemia - presumed ABL anemia.  Will fractionate T bili and hold lovenox today.  May need more workup depending on repeat labs.    Hypothyroid Covid negative  Plan:  Advance diet as tolerated.  Pt is  not requiring pain meds or nausea meds.  Will decide on timing of repeat CT as we progress.  High risk for developing abscess in phlegmonous area.          LOS: 1 day    Stark Klein 07/01/2019 Please see Amion

## 2019-07-02 LAB — COMPREHENSIVE METABOLIC PANEL
ALT: 31 U/L (ref 0–44)
AST: 31 U/L (ref 15–41)
Albumin: 2.7 g/dL — ABNORMAL LOW (ref 3.5–5.0)
Alkaline Phosphatase: 94 U/L (ref 38–126)
Anion gap: 8 (ref 5–15)
BUN: 6 mg/dL (ref 6–20)
CO2: 23 mmol/L (ref 22–32)
Calcium: 7.4 mg/dL — ABNORMAL LOW (ref 8.9–10.3)
Chloride: 105 mmol/L (ref 98–111)
Creatinine, Ser: 0.74 mg/dL (ref 0.44–1.00)
GFR calc Af Amer: >60 mL/min (ref >60)
GFR calc non Af Amer: >60 mL/min (ref >60)
Glucose, Bld: 111 mg/dL — ABNORMAL HIGH (ref 70–99)
Potassium: 3.5 mmol/L (ref 3.5–5.1)
Sodium: 136 mmol/L (ref 135–145)
Total Bilirubin: 2.9 mg/dL — ABNORMAL HIGH (ref 0.3–1.2)
Total Protein: 6 g/dL — ABNORMAL LOW (ref 6.5–8.1)

## 2019-07-02 LAB — CBC WITH DIFFERENTIAL/PLATELET
Abs Immature Granulocytes: 0.14 10*3/uL — ABNORMAL HIGH (ref 0.00–0.07)
Basophils Absolute: 0 10*3/uL (ref 0.0–0.1)
Basophils Relative: 0 %
Eosinophils Absolute: 0.1 10*3/uL (ref 0.0–0.5)
Eosinophils Relative: 1 %
HCT: 26.4 % — ABNORMAL LOW (ref 36.0–46.0)
Hemoglobin: 7.6 g/dL — ABNORMAL LOW (ref 12.0–15.0)
Immature Granulocytes: 1 %
Lymphocytes Relative: 9 %
Lymphs Abs: 0.9 10*3/uL (ref 0.7–4.0)
MCH: 20.8 pg — ABNORMAL LOW (ref 26.0–34.0)
MCHC: 28.8 g/dL — ABNORMAL LOW (ref 30.0–36.0)
MCV: 72.1 fL — ABNORMAL LOW (ref 80.0–100.0)
Monocytes Absolute: 0.7 10*3/uL (ref 0.1–1.0)
Monocytes Relative: 7 %
Neutro Abs: 8.1 10*3/uL — ABNORMAL HIGH (ref 1.7–7.7)
Neutrophils Relative %: 82 %
Platelets: 283 10*3/uL (ref 150–400)
RBC: 3.66 MIL/uL — ABNORMAL LOW (ref 3.87–5.11)
RDW: 17.1 % — ABNORMAL HIGH (ref 11.5–15.5)
WBC: 10 10*3/uL (ref 4.0–10.5)
nRBC: 0 % (ref 0.0–0.2)

## 2019-07-02 NOTE — Progress Notes (Signed)
CC: Abdominal pain  Subjective: Pt tolerated full liquids without n/v.  Continues to have flatus and had a bowel movement yesterday.  Pain overall is continuing to improve, but she feels "pressure" and occasional sharp pain with moving or laughing or coughing.     Objective: Vital signs in last 24 hours: Temp:  [97.9 F (36.6 C)-98.3 F (36.8 C)] 98.3 F (36.8 C) (01/10 0508) Pulse Rate:  [97-110] 103 (01/10 0508) Resp:  [16-18] 16 (01/10 0508) BP: (100-118)/(47-60) 102/60 (01/10 0508) SpO2:  [99 %] 99 % (01/10 0508) Last BM Date: 01/05/21No intake/output recorded  CT of the abdomen shows the cecum is displacing left upper quadrant with evidence of a perforated appendicitis with extensive phlegmon and free intraperitoneal air arising from the inflamed and dilated appendix measuring up to 15 mm in diameter.  No organized abscess or collection of the present time.  There is free fluid in the pelvis seen layering in the pelvis and paracolic gutter.  Intake/Output from previous day: 01/09 0701 - 01/10 0700 In: 2842.3 [P.O.:1200; I.V.:1518.6; IV Piggyback:123.7] Out: 601 [Urine:601] Intake/Output this shift: No intake/output data recorded.  General appearance: alert, cooperative and no distress Resp: breathing comfortably GI: Soft, ND, NT Ext - warm, well perfused.   Skin - a bit pale.    Lab Results:  Recent Labs    07/01/19 0410 07/02/19 0428  WBC 11.7* 10.0  HGB 7.3* 7.6*  HCT 26.3* 26.4*  PLT 278 283    BMET Recent Labs    07/01/19 0410 07/02/19 0428  NA 134* 136  K 3.8 3.5  CL 103 105  CO2 21* 23  GLUCOSE 113* 111*  BUN 8 6  CREATININE 0.81 0.74  CALCIUM 7.3* 7.4*   PT/INR No results for input(s): LABPROT, INR in the last 72 hours.  Recent Labs  Lab 06/30/19 0210 07/01/19 0410 07/01/19 0855 07/02/19 0428  AST 26 49*  --  31  ALT 23 34  --  31  ALKPHOS 83 81  --  94  BILITOT 2.4* 4.0* 4.0* 2.9*  PROT 7.4 5.9*  --  6.0*  ALBUMIN 3.9 2.9*   --  2.7*     Lipase     Component Value Date/Time   LIPASE 22 06/30/2019 0210   Prior to Admission medications   Medication Sig Start Date End Date Taking? Authorizing Provider  amphetamine-dextroamphetamine (ADDERALL XR) 10 MG 24 hr capsule Take 1 tablet (10mg ) every evening 05/30/19  not taking Yes [provider]  ibuprofen (ADVIL) 600 MG tablet Take by mouth. 04/15/18  Yes [provider]  levothyroxine (SYNTHROID, LEVOTHROID) 200 MCG tablet Take 200 mcg by mouth daily before breakfast.   Yes [provider]  methocarbamol (ROBAXIN) 500 MG tablet Take by mouth. 04/23/18  not taking Yes [provider]  busPIRone (BUSPAR) 15 MG tablet Take 15 mg by mouth 2 (two) times daily. 01/03/19  not taking  [provider]  sertraline (ZOLOFT) 25 MG tablet Take 1 tablet (25 mg total) by mouth daily. 09/30/15  not taking  Nat Christen, MD      Medications: . enoxaparin (LOVENOX) injection  50 mg Subcutaneous Q24H  . ketorolac  15 mg Intravenous Q6H  . levothyroxine  200 mcg Oral QAC breakfast    Assessment/Plan  Perforated appendicitis  -Medical management with IV Zosyn/IV hydration/clear liquids  FEN: IV fluids, advance to full liquids.   ID: Zosyn 1/7  >> day 3 DVT: Lovenox- hold today for anemia Follow-up:  Dr. Luisa Hart  Hyperbilirubinemia- improved.  ? Gilbert's dx. Anemia - presumed ABL anemia.  Stable today.  Resume lovenox.    Hypothyroid Covid negative  Plan:  Advance diet to soft.  Pt is not requiring pain meds or nausea meds.  Will decide on timing of repeat CT as we progress.  High risk for developing abscess in phlegmonous area.   Probably home tomorrow if continues to improve.         LOS: 2 days    Almond Lint 07/02/2019 Please see Amion

## 2019-07-02 NOTE — Plan of Care (Signed)

## 2019-07-03 ENCOUNTER — Encounter (HOSPITAL_COMMUNITY): Payer: Self-pay

## 2019-07-03 DIAGNOSIS — F329 Major depressive disorder, single episode, unspecified: Secondary | ICD-10-CM

## 2019-07-03 DIAGNOSIS — F17210 Nicotine dependence, cigarettes, uncomplicated: Secondary | ICD-10-CM

## 2019-07-03 DIAGNOSIS — E039 Hypothyroidism, unspecified: Secondary | ICD-10-CM

## 2019-07-03 DIAGNOSIS — D509 Iron deficiency anemia, unspecified: Secondary | ICD-10-CM

## 2019-07-03 DIAGNOSIS — K3532 Acute appendicitis with perforation and localized peritonitis, without abscess: Principal | ICD-10-CM

## 2019-07-03 LAB — COMPREHENSIVE METABOLIC PANEL
ALT: 26 U/L (ref 0–44)
AST: 21 U/L (ref 15–41)
Albumin: 2.6 g/dL — ABNORMAL LOW (ref 3.5–5.0)
Alkaline Phosphatase: 92 U/L (ref 38–126)
Anion gap: 9 (ref 5–15)
BUN: <5 mg/dL — ABNORMAL LOW (ref 6–20)
CO2: 24 mmol/L (ref 22–32)
Calcium: 7.4 mg/dL — ABNORMAL LOW (ref 8.9–10.3)
Chloride: 104 mmol/L (ref 98–111)
Creatinine, Ser: 0.82 mg/dL (ref 0.44–1.00)
GFR calc Af Amer: >60 mL/min (ref >60)
GFR calc non Af Amer: >60 mL/min (ref >60)
Glucose, Bld: 89 mg/dL (ref 70–99)
Potassium: 3.7 mmol/L (ref 3.5–5.1)
Sodium: 137 mmol/L (ref 135–145)
Total Bilirubin: 2.1 mg/dL — ABNORMAL HIGH (ref 0.3–1.2)
Total Protein: 5.8 g/dL — ABNORMAL LOW (ref 6.5–8.1)

## 2019-07-03 LAB — RETICULOCYTES
Immature Retic Fract: 21.7 % — ABNORMAL HIGH (ref 2.3–15.9)
RBC.: 3.75 MIL/uL — ABNORMAL LOW (ref 3.87–5.11)
Retic Count, Absolute: 53.6 10*3/uL (ref 19.0–186.0)
Retic Ct Pct: 1.4 % (ref 0.4–3.1)

## 2019-07-03 LAB — CBC WITH DIFFERENTIAL/PLATELET
Abs Immature Granulocytes: 0.14 10*3/uL — ABNORMAL HIGH (ref 0.00–0.07)
Basophils Absolute: 0 10*3/uL (ref 0.0–0.1)
Basophils Relative: 0 %
Eosinophils Absolute: 0.1 10*3/uL (ref 0.0–0.5)
Eosinophils Relative: 1 %
HCT: 24 % — ABNORMAL LOW (ref 36.0–46.0)
Hemoglobin: 6.8 g/dL — CL (ref 12.0–15.0)
Immature Granulocytes: 2 %
Lymphocytes Relative: 11 %
Lymphs Abs: 1 10*3/uL (ref 0.7–4.0)
MCH: 20.4 pg — ABNORMAL LOW (ref 26.0–34.0)
MCHC: 28.3 g/dL — ABNORMAL LOW (ref 30.0–36.0)
MCV: 72.1 fL — ABNORMAL LOW (ref 80.0–100.0)
Monocytes Absolute: 0.8 10*3/uL (ref 0.1–1.0)
Monocytes Relative: 9 %
Neutro Abs: 6.8 10*3/uL (ref 1.7–7.7)
Neutrophils Relative %: 77 %
Platelets: 278 10*3/uL (ref 150–400)
RBC: 3.33 MIL/uL — ABNORMAL LOW (ref 3.87–5.11)
RDW: 17.2 % — ABNORMAL HIGH (ref 11.5–15.5)
WBC: 8.7 10*3/uL (ref 4.0–10.5)
nRBC: 0 % (ref 0.0–0.2)

## 2019-07-03 LAB — DIRECT ANTIGLOBULIN TEST (NOT AT ARMC)
DAT, IgG: NEGATIVE
DAT, complement: NEGATIVE

## 2019-07-03 LAB — IRON AND TIBC
Iron: 12 ug/dL — ABNORMAL LOW (ref 28–170)
Saturation Ratios: 3 % — ABNORMAL LOW (ref 10.4–31.8)
TIBC: 391 ug/dL (ref 250–450)
UIBC: 379 ug/dL

## 2019-07-03 LAB — LACTATE DEHYDROGENASE: LDH: 112 U/L (ref 98–192)

## 2019-07-03 LAB — ABO/RH: ABO/RH(D): O POS

## 2019-07-03 LAB — VITAMIN B12: Vitamin B-12: 313 pg/mL (ref 180–914)

## 2019-07-03 LAB — FERRITIN: Ferritin: 26 ng/mL (ref 11–307)

## 2019-07-03 LAB — TRANSFERRIN: Transferrin: 271 mg/dL (ref 192–382)

## 2019-07-03 LAB — PREPARE RBC (CROSSMATCH)

## 2019-07-03 MED ORDER — PANTOPRAZOLE SODIUM 40 MG PO TBEC
40.0000 mg | DELAYED_RELEASE_TABLET | Freq: Every day | ORAL | Status: DC
  Administered 2019-07-03 – 2019-07-04 (×2): 40 mg via ORAL
  Filled 2019-07-03 (×2): qty 1

## 2019-07-03 MED ORDER — SODIUM CHLORIDE 0.9% IV SOLUTION: Freq: Once | INTRAVENOUS | Status: AC

## 2019-07-03 MED ORDER — SODIUM CHLORIDE 0.9 % IV SOLN
250.0000 mg | Freq: Every day | INTRAVENOUS | Status: DC
  Administered 2019-07-03 – 2019-07-04 (×2): 250 mg via INTRAVENOUS
  Filled 2019-07-03: qty 10
  Filled 2019-07-03: qty 20

## 2019-07-03 NOTE — Progress Notes (Signed)
Patient has acritical Hgb of 6.8, Dr. Donell Beers paged for orders.

## 2019-07-03 NOTE — Progress Notes (Addendum)
Subjective: CC: Abdominal pain Patient reports that her pain has improved since admission. It feels similar to yesterday. She notes generalized abdominal soreness with sharp/crampy left sided abdominal pain whenever she tries to sit forward in bed or get to the edge of the bed to stand up. She reports that the sharp/crampy left sided abdominal pain only lasts for a few minutes before subsiding. She is tolerating her soft diet without any emesis. She did have some nausea in the middle of the night. None currently. She is mobilizing in the halls but notes some lightheadedness. She was noted to be anemic this morning. She denies hx of this. Her last BM was yesterday and non-bloody and non-melanous.   Objective: Vital signs in last 24 hours: Temp:  [97.8 F (36.6 C)-99.4 F (37.4 C)] 97.8 F (36.6 C) (01/11 0518) Pulse Rate:  [86-104] 86 (01/11 0518) Resp:  [16-20] 16 (01/11 0518) BP: (111-136)/(57-78) 111/58 (01/11 0518) SpO2:  [97 %-100 %] 97 % (01/11 0518) Last BM Date: 06/27/19(states she has not eaten much, does not feel constipated)  Intake/Output from previous day: 01/10 0701 - 01/11 0700 In: 1198.8 [P.O.:720; I.V.:332.3; IV Piggyback:146.5] Out: 0  Intake/Output this shift: No intake/output data recorded.  PE: Gen:  Alert, NAD, pleasant Card:  RRR Pulm:  CTAB, no W/R/R, effort normal Abd: Soft, ND, LUQ abdominal tenderness without any r/r/g. No peritonitis. +BS Ext:  No LE edema Psych: A&Ox3  Skin: no rashes noted, warm and dry  Lab Results:  Recent Labs    07/02/19 0428 07/03/19 0322  WBC 10.0 8.7  HGB 7.6* 6.8*  HCT 26.4* 24.0*  PLT 283 278   BMET Recent Labs    07/02/19 0428 07/03/19 0322  NA 136 137  K 3.5 3.7  CL 105 104  CO2 23 24  GLUCOSE 111* 89  BUN 6 <5*  CREATININE 0.74 0.82  CALCIUM 7.4* 7.4*   PT/INR No results for input(s): LABPROT, INR in the last 72 hours. CMP     Component Value Date/Time   NA 137 07/03/2019 0322   K 3.7  07/03/2019 0322   CL 104 07/03/2019 0322   CO2 24 07/03/2019 0322   GLUCOSE 89 07/03/2019 0322   BUN <5 (L) 07/03/2019 0322   CREATININE 0.82 07/03/2019 0322   CALCIUM 7.4 (L) 07/03/2019 0322   PROT 5.8 (L) 07/03/2019 0322   ALBUMIN 2.6 (L) 07/03/2019 0322   AST 21 07/03/2019 0322   ALT 26 07/03/2019 0322   ALKPHOS 92 07/03/2019 0322   BILITOT 2.1 (H) 07/03/2019 0322   GFRNONAA >60 07/03/2019 0322   GFRAA >60 07/03/2019 0322   Lipase     Component Value Date/Time   LIPASE 22 06/30/2019 0210       Studies/Results: No results found.  Anti-infectives: Anti-infectives (From admission, onward)   Start     Dose/Rate Route Frequency Ordered Stop   06/30/19 0800  piperacillin-tazobactam (ZOSYN) IVPB 3.375 g     3.375 g 12.5 mL/hr over 240 Minutes Intravenous Every 8 hours 06/30/19 0756     06/30/19 0400  piperacillin-tazobactam (ZOSYN) IVPB 3.375 g     3.375 g 100 mL/hr over 30 Minutes Intravenous  Once 06/30/19 0355 06/30/19 0500       Assessment/Plan Perforated appendicitis - Continue conservative medical management with IV abx - WBC normalized. She is afebrile, tolerating diet, ambulating, pain well controlled  Hyperbilirubinemia - improved. ? Gilbert's dx   Anemia of unknown cause - Hgb  9.1>7.3>7.6>6.8 (this AM). Tranfuse 1U. D/c Lovenox and Toradol. Hematology consult.   Hypothyroidism - Home Levothyroxine   FEN: Soft, SLIV ID: Zosyn 1/7  >> day 4 DVT: SCDs Follow-up: Dr. Luisa Hart    LOS: 3 days    Jacinto Halim , [facility] 07/03/2019, 8:55 AM Please see Amion for pager number during day hours 7:00am-4:30pm

## 2019-07-03 NOTE — Consult Note (Addendum)
[facility]  [phone] [phone]    INITIAL HEMATOLOGY CONSULTATION  Referring MD:  Dr. Harriette Bouillon  Reason for Referral: Microcytic anemia  HPI: Tanya Robbins is a 44 year old female with a past medical history significant for hypothyroidism, PMDD, depression, and anemia.  She presented to the emergency room with abdominal pain x3 days.  Pain worsened with movement or coughing.  CT of the abdomen pelvis with contrast was performed on 06/30/2019 which showed that the cecum was displaced into the left upper quadrant with evidence of perforated appendicitis.  She was admitted to the general surgery service.  Currently being managed medically.  On admission, her white blood cell count was elevated at 13.9, hemoglobin 9.1, MCV 71.3, and platelets were normal at 388,000.  Today, her white blood cell count has normalized but her hemoglobin is down to 6.8 platelets remain normal.  Most recent CBC available to me through care everywhere was performed on 04/15/2018.  She was noted to be anemic with a hemoglobin of 10.4 and also have a low MCV at 75 on that date.  Bilirubin was noted to be elevated on admission at 2.4 and is down to 2.1 today.  Fractionated bilirubin was performed on 07/01/2019 which showed a total bilirubin of 4.0, direct bilirubin 1.1, and indirect bilirubin of 2.9.  Total bilirubin on outside lab work was normal at least as recent as 04/15/2018.  The patient reports that her abdominal pain has somewhat improved today.  Still having some discomfort in her left lower quadrant.  She has not had any headaches or dizziness.  Has not noticed any yellowing of her eyes or skin.  Denies chest discomfort and shortness of breath.  Denies nausea, vomiting, constipation, diarrhea.  She has not noted any epistaxis, bleeding gums, hemoptysis, hematemesis, hematuria, melena, hematochezia.  She states that she had a heavy period which started on 06/26/2019 and just completed  yesterday.  Reports that she has 2 to 3 days where she has to change her pad every 1-2 hours.  With regards to her history of anemia, she reports that she has required oral iron in the past.  She is also required IV iron on at least one occasion.  States that she has never had a blood transfusion.  The patient is widowed and has 4 children.  Denies alcohol use.  Currently smokes half pack of cigarettes per day. Family history significant for a son with autoimmune hemolytic anemia and sickle cell trait (inherited from father).  Hematology was asked see the patient for recommendations regarding her anemia.  Past Medical History:  Diagnosis Date  . Anemia   . Depression   . Thyroid disease    Hypothyroidism  :    Past Surgical History:  Procedure Laterality Date  . CHOLECYSTECTOMY    . THYROIDECTOMY    . TONSILLECTOMY    :   CURRENT MEDS: Current Facility-Administered Medications  Medication Dose Route Frequency Provider Last Rate Last Admin  . 0.9 %  sodium chloride infusion (Manually program via Guardrails IV Fluids)   Intravenous Once Jacinto Halim, PA-C      . acetaminophen (TYLENOL) tablet 650 mg  650 mg Oral Q6H PRN Cornett, Maisie Fus, MD       Or  . acetaminophen (TYLENOL) suppository 650 mg  650 mg Rectal Q6H PRN Cornett, Thomas, MD      . dextrose 5 % and 0.9 % NaCl with KCl 20 mEq/L infusion   Intravenous Continuous Almond Lint,  MD 125 mL/hr at 07/02/19 0815 New Bag at 07/02/19 0815  . levothyroxine (SYNTHROID) tablet 200 mcg  200 mcg Oral QAC breakfast Cornett, Maisie Fus, MD   200 mcg at 07/03/19 0513  . morphine 2 MG/ML injection 1-3 mg  1-3 mg Intravenous Q2H PRN Sherrie George, PA-C   2 mg at 06/30/19 0854  . ondansetron (ZOFRAN-ODT) disintegrating tablet 4 mg  4 mg Oral Q6H PRN Cornett, Thomas, MD       Or  . ondansetron (ZOFRAN) injection 4 mg  4 mg Intravenous Q6H PRN Cornett, Maisie Fus, MD   4 mg at 07/02/19 1753  . oxyCODONE (Oxy IR/ROXICODONE) immediate release  tablet 5-10 mg  5-10 mg Oral Q4H PRN Sherrie George, PA-C   5 mg at 07/03/19 0820  . pantoprazole (PROTONIX) EC tablet 40 mg  40 mg Oral Daily Maczis, Elmer Sow, PA-C      . piperacillin-tazobactam (ZOSYN) IVPB 3.375 g  3.375 g Intravenous Q8H Cornett, Maisie Fus, MD 12.5 mL/hr at 07/03/19 0816 3.375 g at 07/03/19 0816      No Known Allergies:  History reviewed. No pertinent family history.:  Social History   Socioeconomic History  . Marital status: Widowed    Spouse name: Not on file  . Number of children: 4  . Years of education: Not on file  . Highest education level: Not on file  Occupational History  . Not on file  Tobacco Use  . Smoking status: Current Every Day Smoker    Packs/day: 0.50    Types: Cigarettes  . Smokeless tobacco: Never Used  Substance and Sexual Activity  . Alcohol use: No  . Drug use: No  . Sexual activity: Not on file  Other Topics Concern  . Not on file  Social History Narrative  . Not on file   Social Determinants of Health   Financial Resource Strain:   . Difficulty of Paying Living Expenses: Not on file  Food Insecurity:   . Worried About Programme researcher, broadcasting/film/video in the Last Year: Not on file  . Ran Out of Food in the Last Year: Not on file  Transportation Needs:   . Lack of Transportation (Medical): Not on file  . Lack of Transportation (Non-Medical): Not on file  Physical Activity:   . Days of Exercise per Week: Not on file  . Minutes of Exercise per Session: Not on file  Stress:   . Feeling of Stress : Not on file  Social Connections:   . Frequency of Communication with Friends and Family: Not on file  . Frequency of Social Gatherings with Friends and Family: Not on file  . Attends Religious Services: Not on file  . Active Member of Clubs or Organizations: Not on file  . Attends Banker Meetings: Not on file  . Marital Status: Not on file  Intimate Partner Violence:   . Fear of Current or Ex-Partner: Not on file  .  Emotionally Abused: Not on file  . Physically Abused: Not on file  . Sexually Abused: Not on file  :  REVIEW OF SYSTEMS: A comprehensive 14 point review of systems was negative such as noted in the HPI.  Exam: Patient Vitals for the past 24 hrs:  BP Temp Temp src Pulse Resp SpO2  07/03/19 0518 (!) 111/58 97.8 F (36.6 C) Oral 86 16 97 %  07/02/19 2054 (!) 111/57 98.9 F (37.2 C) Oral 89 20 100 %  07/02/19 1311 136/78 99.4 F (37.4 C) -- (!)  104 16 100 %    General:  well-nourished in no acute distress.   Eyes:  no scleral icterus.   ENT:  There were no oropharyngeal lesions.   Neck was without thyromegaly.   Lymphatics:  Negative cervical, supraclavicular or axillary adenopathy.   Respiratory: lungs were clear bilaterally without wheezing or crackles.   Cardiovascular:  Regular rate and rhythm, S1/S2, without murmur, rub or gallop.  There was no pedal edema.   GI:  abdomen was soft, flat, mild tenderness with palpation to the left lower quadrant, nondistended, without organomegaly.  Musculoskeletal:  no spinal tenderness of palpation of vertebral spine.   Skin exam was without ecchymosis, petechiae.   Neuro exam was nonfocal. Patient was alert and oriented.  Attention was good.   Language was appropriate.  Mood was normal without depression.  Speech was not pressured.  Thought content was not tangential.    LABS:  Lab Results  Component Value Date   WBC 8.7 07/03/2019   HGB 6.8 (LL) 07/03/2019   HCT 24.0 (L) 07/03/2019   PLT 278 07/03/2019   GLUCOSE 89 07/03/2019   ALT 26 07/03/2019   AST 21 07/03/2019   NA 137 07/03/2019   K 3.7 07/03/2019   CL 104 07/03/2019   CREATININE 0.82 07/03/2019   BUN <5 (L) 07/03/2019   CO2 24 07/03/2019    CT ABDOMEN PELVIS W CONTRAST  Result Date: 06/30/2019 CLINICAL DATA:  Diffuse abdominal pain for 3 days. EXAM: CT ABDOMEN AND PELVIS WITH CONTRAST TECHNIQUE: Multidetector CT imaging of the abdomen and pelvis was performed using the  standard protocol following bolus administration of intravenous contrast. CONTRAST:  167mL OMNIPAQUE IOHEXOL 300 MG/ML  SOLN COMPARISON:  CT abdomen pelvis 09/30/2015 FINDINGS: Lower chest: Lung bases are clear. Normal heart size. No pericardial effusion. Hepatobiliary: No focal liver abnormality is seen. Patient is post cholecystectomy. Slight prominence of the biliary tree likely related to reservoir effect. No calcified intraductal gallstones. Pancreas: Unremarkable. No pancreatic ductal dilatation or surrounding inflammatory changes. Spleen: Normal in size without focal abnormality. Adrenals/Urinary Tract: Normal adrenal glands. Mild bilateral symmetric perinephric stranding, a nonspecific finding though may correlate with either age or decreased renal function. No worrisome renal lesions. No urolithiasis or hydronephrosis. Mild bladder wall thickening may be partially attributable to decompression of the time of exam. Stomach/Bowel: Distal esophagus, stomach and duodenal sweep are unremarkable. There are a few focally dilated loops of small bowel in the left upper quadrant in a region of extensive stranding and phlegmonous change which may reflect a focal reactive ileus. The cecum is displaced into this region with a distended, hyperemic appendix measuring up to of 15 mm in diameter with evidence of gross perforation and free air. Some adjacent reactive enhancement and thickening of the peritoneal surfaces are also seen in the region of the perforated appendix. No organized abscess or collection is present. Distal colon has a more normal course and appearance without distal colonic wall thickening or dilatation. Vascular/Lymphatic: Atherosclerotic plaque within the normal caliber aorta. Reactive adenopathy in the mesentery. No pathologically enlarged nodes in the abdomen or pelvis. Reproductive: Retroflexed uterus. No concerning adnexal lesions. Other: Free fluid seen layering in the deep pelvis (7 HU) possibly  reactive though nonspecific in a reproductive age female. Extensive phlegmon and free intraperitoneal air arising from the inflamed and dilated appendix seen in the left upper quadrant. Associated features of adjacent peritoneal thickening and stranding compatible with peritonitis. 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 Multilevel degenerative changes are present in the imaged portions of the spine. No acute osseous abnormality or suspicious osseous lesion. IMPRESSION: 1. The cecum is displaced into the left upper quadrant with CT evidence of perforated appendicitis with extensive phlegmon and free intraperitoneal air arising from the inflamed and dilated appendix measuring up to 15 mm in diameter. No organized abscess or collection is present at this time. 2. Free fluid is seen layering in the pelvis and pericolic gutter, which could reflect reactive free fluid or physiologic free fluid in a reproductive age female, succus is less favored given simple attenuation (7 HU). 3. There are a few focally dilated loops of small bowel in the left upper quadrant in a region of extensive stranding and phlegmonous change which may reflect a focal reactive ileus. 4. 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 5. Retroflexed uterus. 6.  Aortic Atherosclerosis (ICD10-I70.0). These results were called by telephone at the time of interpretation on 06/30/2019 at 3:57 am to provider [facility] , who verbally acknowledged these results. Electronically Signed   By: Kreg Shropshire M.D.   On: 06/30/2019 03:58   ASSESSMENT AND PLAN:  1.  Microcytic anemia 2.  Perforated appendicitis 3.  Hyperbilirubinemia 4.  Hypothyroidism  -The patient has a history of microcytic anemia dating back to at least 2012.  Has required oral and IV iron in the past.  Never had a transfusion.  Suspect anemia is related to iron deficiency given her recent heavy menstrual cycle.  Recommend repeating a CT of the abdomen pelvis to evaluate for bleed.  Agree with checking ferritin, iron studies, vitamin B12, folate RBC, and transferrin.  If the patient is iron deficient, will recommend IV iron.  The patient wishes to hold off on a blood transfusion at this point in time as she is asymptomatic.  This would be fine to hold for now pending further lab evaluation. -The patient has a mildly elevated total bilirubin.  Her son has a history of autoimmune hemolytic anemia.  We will also check for evidence of hemolytic anemia with an LDH, reticulocytes, DAT, and haptoglobin.  Thank you for this referral.  Clenton Pare, DNP, AGPCNP-BC, AOCNP  Attending Note  I personally saw the patient, reviewed the chart and examined the patient. The plan of care was discussed with the patient I agree with the assessment and plan as documented above. Thank you very much for the consultation. Severe anemia: Microcytic anemia with a prior history of iron deficiency requiring iron infusions.  At baseline when she arrived to the hospital her hemoglobin was 9.1.  Subsequent to surgery her hemoglobin went down.  Patient feels mildly fatigued but is not terribly symptomatic. Plan: -Agree with holding off on blood transfusions -Plan to give her IV iron (4 doses of Venofer 250 mg each), if she gets discharged we will give the remaining as outpatient -Awaiting iron results.  -Awaiting results of hemolysis Will follow along Thank you very much

## 2019-07-03 NOTE — Progress Notes (Signed)
Verbal order to d/c Lovenox given by Dr Donell Beers.

## 2019-07-04 ENCOUNTER — Telehealth: Payer: Self-pay | Admitting: *Deleted

## 2019-07-04 ENCOUNTER — Telehealth: Payer: Self-pay | Admitting: Hematology and Oncology

## 2019-07-04 LAB — COMPREHENSIVE METABOLIC PANEL
ALT: 24 U/L (ref 0–44)
AST: 19 U/L (ref 15–41)
Albumin: 2.7 g/dL — ABNORMAL LOW (ref 3.5–5.0)
Alkaline Phosphatase: 103 U/L (ref 38–126)
Anion gap: 9 (ref 5–15)
BUN: <5 mg/dL — ABNORMAL LOW (ref 6–20)
CO2: 24 mmol/L (ref 22–32)
Calcium: 7.7 mg/dL — ABNORMAL LOW (ref 8.9–10.3)
Chloride: 103 mmol/L (ref 98–111)
Creatinine, Ser: 0.72 mg/dL (ref 0.44–1.00)
GFR calc Af Amer: >60 mL/min (ref >60)
GFR calc non Af Amer: >60 mL/min (ref >60)
Glucose, Bld: 90 mg/dL (ref 70–99)
Potassium: 3.4 mmol/L — ABNORMAL LOW (ref 3.5–5.1)
Sodium: 136 mmol/L (ref 135–145)
Total Bilirubin: 1.5 mg/dL — ABNORMAL HIGH (ref 0.3–1.2)
Total Protein: 6.1 g/dL — ABNORMAL LOW (ref 6.5–8.1)

## 2019-07-04 LAB — CBC WITH DIFFERENTIAL/PLATELET
Abs Immature Granulocytes: 0.15 10*3/uL — ABNORMAL HIGH (ref 0.00–0.07)
Basophils Absolute: 0 10*3/uL (ref 0.0–0.1)
Basophils Relative: 0 %
Eosinophils Absolute: 0.1 10*3/uL (ref 0.0–0.5)
Eosinophils Relative: 1 %
HCT: 24.9 % — ABNORMAL LOW (ref 36.0–46.0)
Hemoglobin: 7.2 g/dL — ABNORMAL LOW (ref 12.0–15.0)
Immature Granulocytes: 2 %
Lymphocytes Relative: 13 %
Lymphs Abs: 1 10*3/uL (ref 0.7–4.0)
MCH: 20.7 pg — ABNORMAL LOW (ref 26.0–34.0)
MCHC: 28.9 g/dL — ABNORMAL LOW (ref 30.0–36.0)
MCV: 71.8 fL — ABNORMAL LOW (ref 80.0–100.0)
Monocytes Absolute: 0.9 10*3/uL (ref 0.1–1.0)
Monocytes Relative: 11 %
Neutro Abs: 5.7 10*3/uL (ref 1.7–7.7)
Neutrophils Relative %: 73 %
Platelets: 294 10*3/uL (ref 150–400)
RBC: 3.47 MIL/uL — ABNORMAL LOW (ref 3.87–5.11)
RDW: 17.2 % — ABNORMAL HIGH (ref 11.5–15.5)
WBC: 7.8 10*3/uL (ref 4.0–10.5)
nRBC: 0 % (ref 0.0–0.2)

## 2019-07-04 LAB — HAPTOGLOBIN: Haptoglobin: 299 mg/dL — ABNORMAL HIGH (ref 42–296)

## 2019-07-04 MED ORDER — ACETAMINOPHEN 325 MG PO TABS: 650.0000 mg | ORAL_TABLET | Freq: Four times a day (QID) | ORAL | Status: AC | PRN

## 2019-07-04 MED ORDER — TAB-A-VITE/IRON PO TABS: ORAL_TABLET | ORAL | 0 refills | Status: AC

## 2019-07-04 MED ORDER — OXYCODONE HCL 5 MG PO TABS: 5.0000 mg | ORAL_TABLET | Freq: Four times a day (QID) | ORAL | 0 refills | Status: AC | PRN

## 2019-07-04 MED ORDER — TAB-A-VITE/IRON PO TABS
1.0000 | ORAL_TABLET | Freq: Every day | ORAL | Status: DC
  Administered 2019-07-04: 1 via ORAL
  Filled 2019-07-04 (×2): qty 1

## 2019-07-04 MED ORDER — SACCHAROMYCES BOULARDII 250 MG PO CAPS
250.0000 mg | ORAL_CAPSULE | Freq: Two times a day (BID) | ORAL | Status: DC
  Administered 2019-07-04: 250 mg via ORAL
  Filled 2019-07-04: qty 1

## 2019-07-04 MED ORDER — ONDANSETRON 4 MG PO TBDP: 4.0000 mg | ORAL_TABLET | Freq: Four times a day (QID) | ORAL | 0 refills | Status: AC | PRN

## 2019-07-04 MED ORDER — AMOXICILLIN-POT CLAVULANATE 875-125 MG PO TABS: 1.0000 | ORAL_TABLET | Freq: Two times a day (BID) | ORAL | 0 refills | Status: AC

## 2019-07-04 MED ORDER — SACCHAROMYCES BOULARDII 250 MG PO CAPS: ORAL_CAPSULE | ORAL | Status: AC

## 2019-07-04 NOTE — Discharge Instructions (Signed)
Appendicitis, Adult Call if your pain returns, you have fever, nausea, vomiting, trouble voiding.  The appendix is a tube in the body that is shaped like a finger. It is attached to the large intestine. Appendicitis means that this tube is swollen (inflamed). If this is not treated, the tube can tear (rupture). This can lead to a life-threatening infection. This condition can also cause pus to build up in the appendix (abscess). What are the causes? This condition may be caused by something that blocks the appendix. These include:  A ball of poop (stool).  Lymph glands that are bigger than normal. Sometimes the cause is not known. What increases the risk? You are more likely to develop this condition if you are between 28 and 91 years of age. What are the signs or symptoms? Symptoms of this condition include:  Pain around the belly button (navel). ? The pain moves toward the lower right belly (abdomen). ? The pain can get worse with time. ? The pain can get worse if you cough. ? The pain can get worse if you move suddenly.  Tenderness in the lower right belly.  Feeling sick to your stomach (nauseous).  Throwing up (vomiting).  Not feeling hungry (loss of appetite).  A fever.  Having trouble pooping (constipation).  Watery poop (diarrhea).  Not feeling well. How is this treated? Most often, this condition is treated by taking out the appendix (appendectomy). There are two ways to do this:  Open surgery. For this method, the appendix is taken out through a large cut (incision). The cut is made in the lower right belly. This surgery may be used if: ? You have scars from another surgery. ? You have a bleeding condition. ? You are pregnant and will be having your baby soon. ? You have a condition that makes it hard to do the other type of surgery.  Laparoscopic surgery. For this method, the appendix is taken out through small cuts. Often, this surgery: ? Causes less  pain. ? Causes fewer problems. ? Is easier to heal from. If your appendix tears and pus forms:  A drain may be put into the sore. The drain will be used to get rid of the pus.  You may get an antibiotic medicine through an IV line.  Your appendix may or may not need to be taken out. Follow these instructions at home: If you had surgery, follow instructions from your doctor on how to care for yourself at home and how to take care of your cut from surgery. Medicines  Take over-the-counter and prescription medicines only as told by your doctor.  If you were prescribed an antibiotic medicine, take it as told by your doctor. Do not stop taking the antibiotic even if you start to feel better. Eating and drinking Follow instructions from your doctor about what you cannot eat or drink. You may go back to your diet slowly if:  You no longer feel sick to your stomach.  You have stopped throwing up. General instructions  Do not use any products that contain nicotine or tobacco, such as cigarettes, e-cigarettes, and chewing tobacco. If you need help quitting, ask your doctor.  Do not drive or use heavy machinery while taking prescription pain medicine.  Ask your doctor if the medicine you are taking can cause trouble pooping. You may need to take steps to prevent or treat trouble pooping: ? Drink enough fluid to keep your pee (urine) pale yellow. ? Take over-the-counter or prescription  medicines. ? Eat foods that are high in fiber. These include beans, whole grains, and fresh fruits and vegetables. ? Limit foods that are high in fat and sugar. These include fried or sweet foods.  Keep all follow-up visits as told by your doctor. This is important. Contact a doctor if:  There is pus, blood, or a lot of fluid coming from your cut or cuts from surgery.  You are sick to your stomach or you throw up. Get help right away if:  You have pain in your belly, and the pain is getting worse.  You  have a fever.  You have chills.  You are very tired.  You have muscle pain.  You are short of breath. Summary  Appendicitis is swelling of the appendix. The appendix is a tube that is shaped like a finger. It is joined to the large intestine.  This condition may be caused by something that blocks the appendix. This can lead to an infection.  This condition is usually treated by taking out the appendix. This information is not intended to replace advice given to you by your health care provider. Make sure you discuss any questions you have with your health care provider. Document Revised: 11/24/2017 Document Reviewed: 11/24/2017 Elsevier Patient Education  [place].

## 2019-07-04 NOTE — Progress Notes (Signed)
D/C instructions given to patient. Patient had no questions. Patient son will pick her up around 5:30pm after he gets off work. NT or writer will wheel patient out once her rides get here

## 2019-07-04 NOTE — Progress Notes (Signed)
Subjective: CC:  Patient reports that last night she developed a small HA, some sob and felt overall fatigued. This has all resolved this monring. She has not ambulated in the halls this morning, so she is unsure if she still feels lightheaded or fatigued with ambulation. She reports her abdominal pain is still on the left side and improving. She is tolerating her diet without any n/v. She reports a soft Bm yesterday that was non-bloody.   Objective: Vital signs in last 24 hours: Temp:  [98.2 F (36.8 C)-98.8 F (37.1 C)] 98.5 F (36.9 C) (01/12 0532) Pulse Rate:  [86-88] 87 (01/12 0532) Resp:  [16] 16 (01/12 0532) BP: (108-137)/(65-75) 114/65 (01/12 0532) SpO2:  [98 %-100 %] 98 % (01/12 0532) Last BM Date: 07/03/19  Intake/Output from previous day: 01/11 0701 - 01/12 0700 In: 1441.9 [P.O.:1197; I.V.:131.3; IV Piggyback:113.5] Out: 0  Intake/Output this shift: No intake/output data recorded.  PE: Gen:  Alert, NAD, pleasant Card:  RRR Pulm:  CTAB, no W/R/R, effort normal Abd: Soft, ND, left sided abdominal tenderness, greater in the upper quadrant without any r/r/g. No peritonitis. +BS Ext:  No LE edema Psych: A&Ox3  Skin: no rashes noted, warm and dry  Lab Results:  Recent Labs    07/03/19 0322 07/04/19 0322  WBC 8.7 7.8  HGB 6.8* 7.2*  HCT 24.0* 24.9*  PLT 278 294   BMET Recent Labs    07/03/19 0322 07/04/19 0322  NA 137 136  K 3.7 3.4*  CL 104 103  CO2 24 24  GLUCOSE 89 90  BUN <5* <5*  CREATININE 0.82 0.72  CALCIUM 7.4* 7.7*   PT/INR No results for input(s): LABPROT, INR in the last 72 hours. CMP     Component Value Date/Time   NA 136 07/04/2019 0322   K 3.4 (L) 07/04/2019 0322   CL 103 07/04/2019 0322   CO2 24 07/04/2019 0322   GLUCOSE 90 07/04/2019 0322   BUN <5 (L) 07/04/2019 0322   CREATININE 0.72 07/04/2019 0322   CALCIUM 7.7 (L) 07/04/2019 0322   PROT 6.1 (L) 07/04/2019 0322   ALBUMIN 2.7 (L) 07/04/2019 0322   AST 19 07/04/2019  0322   ALT 24 07/04/2019 0322   ALKPHOS 103 07/04/2019 0322   BILITOT 1.5 (H) 07/04/2019 0322   GFRNONAA >60 07/04/2019 0322   GFRAA >60 07/04/2019 0322   Lipase     Component Value Date/Time   LIPASE 22 06/30/2019 0210       Studies/Results: No results found.  Anti-infectives: Anti-infectives (From admission, onward)   Start     Dose/Rate Route Frequency Ordered Stop   06/30/19 0800  piperacillin-tazobactam (ZOSYN) IVPB 3.375 g     3.375 g 12.5 mL/hr over 240 Minutes Intravenous Every 8 hours 06/30/19 0756     06/30/19 0400  piperacillin-tazobactam (ZOSYN) IVPB 3.375 g     3.375 g 100 mL/hr over 30 Minutes Intravenous  Once 06/30/19 0355 06/30/19 0500       Assessment/Plan Perforated appendicitis - Continue conservative medical management with IV abx - WBC normalized. She is afebrile, tolerating diet, ambulating, pain well controlled - Possible d/c later today with 7d of oral abx and follow up in the office  Hyperbilirubinemia - improved. ? Gilbert's dx   Microcytic Anemia - Hgb 9.1>7.3>7.6>6.8>7.8. Appreciate hematologies assistance. Per their notes, and studies, suspected to be iron deficiency anemia. They plan to give her IV iron (4 doses of Venofer 250 mg each). She  received her first dose yesterday. Second dose is at 1000 this AM. We will plan to ambulate her after infusion to ensure she is not symptomatic for possible PM discharge. If she gets discharged Hematologies note says they will give the remaining as outpatient  Hypothyroidism - Home Levothyroxine   FEN: Soft, SLIV ID: Zosyn 1/7 >>day 5 DVT: SCDs Follow-up: Dr. Luisa Hart  Plan: Iron infusion this AM. After infusion, ambulate to ensure patient is not symptomatic from anemia for a possible d/c in the afternoon.    LOS: 4 days    Jacinto Halim , [facility] 07/04/2019, 8:24 AM Please see Amion for pager number during day hours 7:00am-4:30pm

## 2019-07-04 NOTE — Telephone Encounter (Signed)
I could not reach patient regarding schedule no voicemail setup 

## 2019-07-04 NOTE — Progress Notes (Signed)
Hematology  CBC Latest Ref Rng & Units 07/04/2019 07/03/2019 07/02/2019  WBC 4.0 - 10.5 K/uL 7.8 8.7 10.0  Hemoglobin 12.0 - 15.0 g/dL 7.2(L) 6.8(LL) 7.6(L)  Hematocrit 36.0 - 46.0 % 24.9(L) 24.0(L) 26.4(L)  Platelets 150 - 400 K/uL 294 278 283   Microcytic anemia due to iron deficiency: Hemoglobin has improved slightly. She is likely to be discharged home today.  She will receive the second dose of IV iron today. We will arrange for 2 additional doses of IV iron to be given as an outpatient. We will call the patient with these appointments. I will see her in 1 month for follow-up of labs.

## 2019-07-04 NOTE — Discharge Summary (Signed)
Physician Discharge Summary  Patient ID: Tanya Robbins MRN: [id number] DOB/AGE: [birth year] 44 y.o.  Admit date: 06/30/2019 Discharge date: 07/04/2019  Admission Diagnoses:  Perforated appendicitis Hypothyroid Covid negative  Discharge Diagnoses:  Perforated appendicitis Hyperbilirubinemia Microcytic anemia Hypothyroidism  Active Problems:   Perforated appendicitis   PROCEDURES: None  Hospital Course:   HPI: Patient transferred from the [facility] emergency room after being seen by Dr. Read Drivers for a 3-day history of lower abdominal pain.  The pain started 3 days ago and was diffuse in her lower abdomen.  Last night it became worse and more centralized and toward the left lower quadrant.  The pain was made worse by coughing or moving.  It is sharp in nature and severe without radiation.  Pain medication does help relieve it.  CT scan showed a left-sided cecum with a perforated appendix with small micro lobules of free air around the appendix.  There is trace pelvic fluid.  She complains of lower abdominal pain which is severe 8 out of 10 without radiation location left lower quadrant and below her umbilicus.  She was seen in the ED and evaluated by Dr. Harriette Bouillon.  It was his opinion she had perforated appendicitis without an abscess.  They discussed possibility of laparoscopic versus open surgery.  Patient opted for medical management.  She was placed on IV antibiotics at that time.  With IV antibiotic therapy her symptoms improved as did her WBC.  Her diet was advanced and she was ultimately converted over to oral antibiotics at the time of discharge.  Patient has a history of microcytic anemia came in with hemoglobin of 9.1.  With IV hydration it steadily declined to a low of 6.8.  She also developed a hyperbilirubinemia.  We tentatively plan to transfuse her at that point because she was somewhat symptomatic.  A hematology oncology consult was obtained.  She was seen by Dr.Vinay  Pamelia Hoit.  It was their opinion that we could postpone transfusion she was started on IV iron therapy.  After 2 days of iron infusion she was feeling better and less symptomatic.  She was stable from her perforated appendicitis.  Her bilirubin which was 2.4 on admission went up to 4.0, came down to 1.5.  By the afternoon of 07/04/2019 she felt she was ready for discharge.  She will follow-up with Dr. Luisa Hart for her perforated appendicitis and Dr. Pamelia Hoit for her anemia as listed below.  She has a PCP is not listed and I recommended she contact them and let them obtain the same information through epic.  We discussed her having recurrent symptoms and if that were to occur she is to call our office for follow-up as soon as possible.  Zofran and oxycodone were there to help treat symptoms while she was obtaining further medical assistance.   Condition on discharge: Improving   CBC Latest Ref Rng & Units 07/04/2019 07/03/2019 07/02/2019  WBC 4.0 - 10.5 K/uL 7.8 8.7 10.0  Hemoglobin 12.0 - 15.0 g/dL 7.2(L) 6.8(LL) 7.6(L)  Hematocrit 36.0 - 46.0 % 24.9(L) 24.0(L) 26.4(L)  Platelets 150 - 400 K/uL 294 278 283   CMP Latest Ref Rng & Units 07/04/2019 07/03/2019 07/02/2019  Glucose 70 - 99 mg/dL 90 89 564(P)  BUN 6 - 20 mg/dL <3(I) <9(J) 6  Creatinine 0.44 - 1.00 mg/dL 1.88 4.16 6.06  Sodium 135 - 145 mmol/L 136 137 136  Potassium 3.5 - 5.1 mmol/L 3.4(L) 3.7 3.5  Chloride 98 - 111 mmol/L 103 104  105  CO2 22 - 32 mmol/L 24 24 23   Calcium 8.9 - 10.3 mg/dL 7.7(L) 7.4(L) 7.4(L)  Total Protein 6.5 - 8.1 g/dL 6.1(L) 5.8(L) 6.0(L)  Total Bilirubin 0.3 - 1.2 mg/dL 1.5(H) 2.1(H) 2.9(H)  Alkaline Phos 38 - 126 U/L 103 92 94  AST 15 - 41 U/L 19 21 31   ALT 0 - 44 U/L 24 26 31     Disposition: Discharge disposition: 01-Home or Self Care        Allergies as of 07/04/2019   No Known Allergies     Medication List    TAKE these medications   acetaminophen 325 MG tablet Commonly known as: TYLENOL Take 2  tablets (650 mg total) by mouth every 6 (six) hours as needed for mild pain, moderate pain, fever or headache (or temp > 100).   amoxicillin-clavulanate 875-125 MG tablet Commonly known as: Augmentin Take 1 tablet by mouth 2 (two) times daily for 7 days.   levothyroxine 200 MCG tablet Commonly known as: SYNTHROID Take 200 mcg by mouth daily before breakfast.   multivitamins with iron Tabs tablet Multivitamin with iron   Take one daily.  You can buy this over the counter at any drug store.   ondansetron 4 MG disintegrating tablet Commonly known as: ZOFRAN-ODT Take 1 tablet (4 mg total) by mouth every 6 (six) hours as needed for nausea.   oxyCODONE 5 MG immediate release tablet Commonly known as: Oxy IR/ROXICODONE Take 1 tablet (5 mg total) by mouth every 6 (six) hours as needed for severe pain or breakthrough pain. Call if you start needing to use this more.   saccharomyces boulardii 250 MG capsule Commonly known as: FLORASTOR This is a probiotic, I would get one at the drug store and take for the next month.  Follow package directions.  It helps restore normal flora to your gut the antibiotic affect.      Follow-up Information    Erroll Luna, MD Follow up on 08/03/2019.   Specialty: General Surgery Why: Your appointment is at 11:20AM.  Be at the office 30 minutes early for check in.  Bring photo ID and insurance information with you.   Contact information: [address] [place] [address] [phone]        Nicholas Lose, MD Follow up.   Specialty: Hematology and Oncology Why: Office should call you for follow up iron infusions, and follow up with Dr. Lindi Adie.  if you do not hear from them in 1 week call and inquire. Contact information: [place] [address] [phone]        Call your primary care and let them know about your admission Follow up.   Why: Call your primary care and let them know about your admission.             SignedEarnstine Regal 07/04/2019, 3:55 PM

## 2019-07-04 NOTE — Progress Notes (Signed)
HEMATOLOGY-ONCOLOGY PROGRESS NOTE  SUBJECTIVE: Tolerated first dose of IV iron well overall.  States that she has some increased fatigue and mild headache last night.  Abdominal pain seems to be better controlled today.  Able to eat and drink without difficulty.  She might discharge home later today depending on how she does throughout the day.  REVIEW OF SYSTEMS:   Constitutional: Denies fevers, chills or abnormal weight loss Eyes: Denies blurriness of vision Ears, nose, mouth, throat, and face: Denies mucositis or sore throat Respiratory: Denies cough, dyspnea or wheezes Cardiovascular: Denies palpitation, chest discomfort Gastrointestinal:  Denies nausea, heartburn or change in bowel habits Skin: Denies abnormal skin rashes Lymphatics: Denies new lymphadenopathy or easy bruising Neurological:Denies numbness, tingling or new weaknesses, mild headache last night but now resolved Behavioral/Psych: Mood is stable, no new changes  Extremities: No lower extremity edema All other systems were reviewed with the patient and are negative.  I have reviewed the past medical history, past surgical history, social history and family history with the patient and they are unchanged from previous note.   PHYSICAL EXAMINATION:  Vitals:   07/03/19 2228 07/04/19 0532  BP: 137/75 114/65  Pulse: 88 87  Resp: 16 16  Temp: 98.8 F (37.1 C) 98.5 F (36.9 C)  SpO2: 100% 98%   Filed Weights   06/30/19 0158  Weight: 224 lb (101.6 kg)    Intake/Output from previous day: 01/11 0701 - 01/12 0700 In: 1441.9 [P.O.:1197; I.V.:131.3; IV Piggyback:113.5] Out: 0   GENERAL:alert, no distress and comfortable SKIN: skin color, texture, turgor are normal, no rashes or significant lesions EYES: normal, Conjunctiva are pink and non-injected, sclera clear OROPHARYNX:no exudate, no erythema and lips, buccal mucosa, and tongue normal  NECK: supple, thyroid normal size, non-tender, without nodularity LYMPH:  no  palpable lymphadenopathy in the cervical, axillary or inguinal LUNGS: clear to auscultation and percussion with normal breathing effort HEART: regular rate & rhythm and no murmurs and no lower extremity edema ABDOMEN:abdomen soft, non-tender and normal bowel sounds Musculoskeletal:no cyanosis of digits and no clubbing  NEURO: alert & oriented x 3 with fluent speech, no focal motor/sensory deficits  LABORATORY DATA:  I have reviewed the data as listed CMP Latest Ref Rng & Units 07/04/2019 07/03/2019 07/02/2019  Glucose 70 - 99 mg/dL 90 89 161(W)  BUN 6 - 20 mg/dL <9(U) <0(A) 6  Creatinine 0.44 - 1.00 mg/dL 5.40 9.81 1.91  Sodium 135 - 145 mmol/L 136 137 136  Potassium 3.5 - 5.1 mmol/L 3.4(L) 3.7 3.5  Chloride 98 - 111 mmol/L 103 104 105  CO2 22 - 32 mmol/L 24 24 23   Calcium 8.9 - 10.3 mg/dL 7.7(L) 7.4(L) 7.4(L)  Total Protein 6.5 - 8.1 g/dL 6.1(L) 5.8(L) 6.0(L)  Total Bilirubin 0.3 - 1.2 mg/dL ) 2.1(H) 2.9(H)  Alkaline Phos 38 - 126 U/L 103 92 94  AST 15 - 41 U/L 19 21 31   ALT 0 - 44 U/L 24 26 31     Lab Results  Component Value Date   WBC 7.8 07/04/2019   HGB 7.2 (L) 07/04/2019   HCT 24.9 (L) 07/04/2019   MCV 71.8 (L) 07/04/2019   PLT 294 07/04/2019   NEUTROABS 5.7 07/04/2019    CT ABDOMEN PELVIS W CONTRAST  Result Date: 06/30/2019 CLINICAL DATA:  Diffuse abdominal pain for 3 days. EXAM: CT ABDOMEN AND PELVIS WITH CONTRAST TECHNIQUE: Multidetector CT imaging of the abdomen and pelvis was performed using the standard protocol following bolus administration of intravenous contrast. CONTRAST:  15mL OMNIPAQUE IOHEXOL 300 MG/ML  SOLN COMPARISON:  CT abdomen pelvis 09/30/2015 FINDINGS: Lower chest: Lung bases are clear. Normal heart size. No pericardial effusion. Hepatobiliary: No focal liver abnormality is seen. Patient is post cholecystectomy. Slight prominence of the biliary tree likely related to reservoir effect. No calcified intraductal gallstones. Pancreas: Unremarkable. No  pancreatic ductal dilatation or surrounding inflammatory changes. Spleen: Normal in size without focal abnormality. Adrenals/Urinary Tract: Normal adrenal glands. Mild bilateral symmetric perinephric stranding, a nonspecific finding though may correlate with either age or decreased renal function. No worrisome renal lesions. No urolithiasis or hydronephrosis. Mild bladder wall thickening may be partially attributable to decompression of the time of exam. Stomach/Bowel: Distal esophagus, stomach and duodenal sweep are unremarkable. There are a few focally dilated loops of small bowel in the left upper quadrant in a region of extensive stranding and phlegmonous change which may reflect a focal reactive ileus. The cecum is displaced into this region with a distended, hyperemic appendix measuring up to of 15 mm in diameter with evidence of gross perforation and free air. Some adjacent reactive enhancement and thickening of the peritoneal surfaces are also seen in the region of the perforated appendix. No organized abscess or collection is present. Distal colon has a more normal course and appearance without distal colonic wall thickening or dilatation. Vascular/Lymphatic: Atherosclerotic plaque within the normal caliber aorta. Reactive adenopathy in the mesentery. No pathologically enlarged nodes in the abdomen or pelvis. Reproductive: Retroflexed uterus. No concerning adnexal lesions. Other: Free fluid seen layering in the deep pelvis (7 HU) possibly reactive though nonspecific in a reproductive age female. Extensive phlegmon and free intraperitoneal air arising from the inflamed and dilated appendix seen in the left upper quadrant. Associated features of adjacent peritoneal thickening and stranding compatible with peritonitis. 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 Multilevel degenerative changes are present in the imaged portions of the spine. No acute osseous  abnormality or suspicious osseous lesion. IMPRESSION: 1. The cecum is displaced into the left upper quadrant with CT evidence of perforated appendicitis with extensive phlegmon and free intraperitoneal air arising from the inflamed and dilated appendix measuring up to 15 mm in diameter. No organized abscess or collection is present at this time. 2. Free fluid is seen layering in the pelvis and pericolic gutter, which could reflect reactive free fluid or physiologic free fluid in a reproductive age female, succus is less favored given simple attenuation (7 HU). 3. There are a few focally dilated loops of small bowel in the left upper quadrant in a region of extensive stranding and phlegmonous change which may reflect a focal reactive ileus. 4. 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 5. Retroflexed uterus. 6.  Aortic Atherosclerosis (ICD10-I70.0). These results were called by telephone at the time of interpretation on 06/30/2019 at 3:57 am to provider [facility] , who verbally acknowledged these results. Electronically Signed   By: Lovena Le M.D.   On: 06/30/2019 03:58    ASSESSMENT AND PLAN: 1.  Microcytic anemia 2.  Perforated appendicitis 3.  Hyperbilirubinemia 4.  Hypothyroidism  -Received first dose of IV iron yesterday which she tolerated well.  Hemoglobin has improved to 7.2 this morning.  She is asymptomatic from her anemia this morning.  Recommend for her to proceed with her second dose of IV iron today.  She may be going home later today and we can arrange for the remaining doses to be given at  the cancer center. -Bilirubin is trending downward.  Work-up did not show any evidence of hemolysis.   LOS: 4 days   Clenton Pare, DNP, AGPCNP-BC, AOCNP 07/04/19

## 2019-07-04 NOTE — Telephone Encounter (Signed)
RN able to reach pt regarding schedule.  RN reviewed upcoming schedule and pt verbalized understanding of date and times.

## 2019-07-05 LAB — FOLATE RBC
Folate, Hemolysate: 229 ng/mL
Folate, RBC: UNDETERMINED ng/mL

## 2019-07-06 ENCOUNTER — Other Ambulatory Visit: Payer: Self-pay | Admitting: Hematology and Oncology

## 2019-07-07 ENCOUNTER — Other Ambulatory Visit: Payer: Self-pay | Admitting: Hematology and Oncology

## 2019-07-07 DIAGNOSIS — D500 Iron deficiency anemia secondary to blood loss (chronic): Secondary | ICD-10-CM | POA: Insufficient documentation

## 2019-07-07 LAB — BPAM RBC
Blood Product Expiration Date: 202102102359
Unit Type and Rh: 5100

## 2019-07-07 LAB — TYPE AND SCREEN
ABO/RH(D): O POS
Antibody Screen: NEGATIVE
Unit division: 0

## 2019-07-12 ENCOUNTER — Inpatient Hospital Stay: Payer: Medicaid Other

## 2019-07-19 ENCOUNTER — Inpatient Hospital Stay: Payer: Medicaid Other | Attending: Hematology and Oncology

## 2019-08-17 ENCOUNTER — Other Ambulatory Visit: Payer: Self-pay | Admitting: *Deleted

## 2019-08-17 DIAGNOSIS — D500 Iron deficiency anemia secondary to blood loss (chronic): Secondary | ICD-10-CM

## 2019-08-18 ENCOUNTER — Inpatient Hospital Stay: Payer: Medicaid Other | Attending: Hematology and Oncology | Admitting: Hematology and Oncology

## 2019-08-18 ENCOUNTER — Inpatient Hospital Stay: Payer: Medicaid Other
